# Patient Record
Sex: Female | Born: 1960 | Race: White | Hispanic: No | Smoking: Current every day smoker
Health system: Southern US, Community
[De-identification: ages and names within clinical notes are randomized; demographics above are authoritative.]

## PROBLEM LIST (undated history)

## (undated) DIAGNOSIS — I10 Essential (primary) hypertension: Secondary | ICD-10-CM

## (undated) HISTORY — PX: ANUS SURGERY: SHX302

## (undated) HISTORY — PX: ABDOMINAL HYSTERECTOMY: SHX81

## (undated) HISTORY — PX: EYE SURGERY: SHX253

---

## 2002-02-14 HISTORY — PX: BACK SURGERY: SHX140

## 2005-07-07 ENCOUNTER — Other Ambulatory Visit: Admission: RE | Admit: 2005-07-07 | Discharge: 2005-07-07 | Payer: Self-pay | Admitting: Family Medicine

## 2005-07-18 ENCOUNTER — Encounter: Admission: RE | Admit: 2005-07-18 | Discharge: 2005-07-18 | Payer: Self-pay | Admitting: Family Medicine

## 2005-08-26 ENCOUNTER — Encounter: Admission: RE | Admit: 2005-08-26 | Discharge: 2005-08-26 | Payer: Self-pay | Admitting: Family Medicine

## 2005-09-02 ENCOUNTER — Encounter: Admission: RE | Admit: 2005-09-02 | Discharge: 2005-09-02 | Payer: Self-pay | Admitting: Family Medicine

## 2006-09-25 ENCOUNTER — Other Ambulatory Visit: Admission: RE | Admit: 2006-09-25 | Discharge: 2006-09-25 | Payer: Self-pay | Admitting: *Deleted

## 2007-10-11 ENCOUNTER — Other Ambulatory Visit: Admission: RE | Admit: 2007-10-11 | Discharge: 2007-10-11 | Payer: Self-pay | Admitting: Family Medicine

## 2007-11-01 ENCOUNTER — Encounter: Admission: RE | Admit: 2007-11-01 | Discharge: 2007-11-01 | Payer: Self-pay | Admitting: Family Medicine

## 2007-11-08 ENCOUNTER — Encounter: Admission: RE | Admit: 2007-11-08 | Discharge: 2007-11-08 | Payer: Self-pay | Admitting: Family Medicine

## 2008-09-25 ENCOUNTER — Other Ambulatory Visit: Admission: RE | Admit: 2008-09-25 | Discharge: 2008-09-25 | Payer: Self-pay | Admitting: Family Medicine

## 2009-01-09 ENCOUNTER — Encounter: Admission: RE | Admit: 2009-01-09 | Discharge: 2009-01-09 | Payer: Self-pay | Admitting: Family Medicine

## 2009-11-30 ENCOUNTER — Other Ambulatory Visit: Admission: RE | Admit: 2009-11-30 | Discharge: 2009-11-30 | Payer: Self-pay | Admitting: Family Medicine

## 2009-12-03 ENCOUNTER — Encounter: Admission: RE | Admit: 2009-12-03 | Discharge: 2009-12-03 | Payer: Self-pay | Admitting: Family Medicine

## 2010-01-12 ENCOUNTER — Encounter (INDEPENDENT_AMBULATORY_CARE_PROVIDER_SITE_OTHER): Payer: Self-pay | Admitting: Obstetrics and Gynecology

## 2010-01-12 ENCOUNTER — Inpatient Hospital Stay (HOSPITAL_COMMUNITY)
Admission: RE | Admit: 2010-01-12 | Discharge: 2010-01-13 | Payer: Self-pay | Source: Home / Self Care | Admitting: Obstetrics and Gynecology

## 2010-03-07 ENCOUNTER — Encounter: Payer: Self-pay | Admitting: Family Medicine

## 2010-04-27 LAB — SURGICAL PCR SCREEN
MRSA, PCR: NEGATIVE
Staphylococcus aureus: NEGATIVE

## 2010-04-27 LAB — CBC
HCT: 42.7 % (ref 36.0–46.0)
Hemoglobin: 13 g/dL (ref 12.0–15.0)
MCH: 29.3 pg (ref 26.0–34.0)
MCHC: 33.3 g/dL (ref 30.0–36.0)
Platelets: 211 10*3/uL (ref 150–400)
Platelets: 234 10*3/uL (ref 150–400)
RBC: 4.44 MIL/uL (ref 3.87–5.11)
RDW: 13.1 % (ref 11.5–15.5)
WBC: 14 10*3/uL — ABNORMAL HIGH (ref 4.0–10.5)
WBC: 8.9 10*3/uL (ref 4.0–10.5)

## 2010-04-27 LAB — BASIC METABOLIC PANEL
Calcium: 9.5 mg/dL (ref 8.4–10.5)
GFR calc Af Amer: >60 mL/min (ref >60)
GFR calc non Af Amer: >60 mL/min (ref >60)
Potassium: 4.4 mEq/L (ref 3.5–5.1)
Sodium: 135 mEq/L (ref 135–145)

## 2010-04-27 LAB — PREGNANCY, URINE: Preg Test, Ur: NEGATIVE

## 2012-02-20 ENCOUNTER — Telehealth (HOSPITAL_COMMUNITY): Payer: Self-pay | Admitting: *Deleted

## 2012-02-20 NOTE — Telephone Encounter (Signed)
Telephoned patient at home # and left message to return call to [facility] 

## 2012-02-21 ENCOUNTER — Other Ambulatory Visit: Payer: Self-pay | Admitting: Obstetrics and Gynecology

## 2012-02-21 DIAGNOSIS — Z1231 Encounter for screening mammogram for malignant neoplasm of breast: Secondary | ICD-10-CM

## 2012-03-01 ENCOUNTER — Encounter (HOSPITAL_COMMUNITY): Payer: Self-pay | Admitting: *Deleted

## 2012-03-13 ENCOUNTER — Ambulatory Visit (HOSPITAL_COMMUNITY)
Admission: RE | Admit: 2012-03-13 | Discharge: 2012-03-13 | Disposition: A | Discharge status: Home / Self Care | Payer: Self-pay | Source: Ambulatory Visit | Attending: Obstetrics and Gynecology | Admitting: Obstetrics and Gynecology

## 2012-03-13 ENCOUNTER — Encounter (HOSPITAL_COMMUNITY): Payer: Self-pay

## 2012-03-13 VITALS — BP 128/80 | Temp 98.2°F | Ht 65.0 in | Wt 231.8 lb

## 2012-03-13 DIAGNOSIS — Z1239 Encounter for other screening for malignant neoplasm of breast: Secondary | ICD-10-CM

## 2012-03-13 DIAGNOSIS — Z1231 Encounter for screening mammogram for malignant neoplasm of breast: Secondary | ICD-10-CM

## 2012-03-13 HISTORY — DX: Essential (primary) hypertension: I10

## 2012-03-13 NOTE — Patient Instructions (Signed)
Taught patient how to perform BSE and gave educational materials to take home. Patient did not need a Pap smear today due to a history of a hysterectomy for benign reasons. Told patient she would not need any further Pap smears due to history of a hysterectomy. Let patient know will follow up with her within the next couple weeks with results by letter or phone. Patient escorted to mammography for a screening mammogram. Patient verbalized understanding.

## 2012-03-13 NOTE — Progress Notes (Signed)
No complaints today.  Pap Smear:    Pap smear not completed today. Last Pap smear was 11/30/2009 and normal. Per patient no history of abnormal Pap smears. Patient has a history of a hysterectomy for DUB. Let patient know she would not need any further Pap smears due to her history of hysterectomy. Pap smear result above is in EPIC.  Physical exam: Breasts Breasts symmetrical. No skin abnormalities bilateral breasts. No nipple retraction bilateral breasts. No nipple discharge bilateral breasts. No lymphadenopathy. No lumps palpated bilateral breasts. No complaints of pain or tenderness on exam. Patient escorted to mammography for a screening mammogram.        Pelvic/Bimanual No Pap smear completed today since last Pap smear was 11/30/2009 and patient has a history of a hysterectomy for benign reasons. Pap smear not indicated per [facility] guidelines.

## 2013-04-08 ENCOUNTER — Other Ambulatory Visit: Payer: Self-pay | Admitting: Gastroenterology

## 2013-10-09 ENCOUNTER — Encounter (HOSPITAL_COMMUNITY): Admission: RE | Payer: Self-pay | Source: Ambulatory Visit

## 2013-10-09 ENCOUNTER — Ambulatory Visit (HOSPITAL_COMMUNITY): Admission: RE | Admit: 2013-10-09 | Payer: Self-pay | Source: Ambulatory Visit | Admitting: Obstetrics & Gynecology

## 2013-10-09 SURGERY — ANTERIOR (CYSTOCELE) AND POSTERIOR REPAIR (RECTOCELE)
Anesthesia: General

## 2013-12-16 ENCOUNTER — Encounter (HOSPITAL_COMMUNITY): Payer: Self-pay

## 2014-06-06 ENCOUNTER — Ambulatory Visit (HOSPITAL_COMMUNITY): Payer: BLUE CROSS/BLUE SHIELD | Attending: Cardiology | Admitting: Cardiology

## 2014-06-06 ENCOUNTER — Other Ambulatory Visit (HOSPITAL_COMMUNITY): Payer: Self-pay | Admitting: Family Medicine

## 2014-06-06 DIAGNOSIS — Z720 Tobacco use: Secondary | ICD-10-CM | POA: Diagnosis not present

## 2014-06-06 DIAGNOSIS — I10 Essential (primary) hypertension: Secondary | ICD-10-CM | POA: Insufficient documentation

## 2014-06-06 DIAGNOSIS — Z8249 Family history of ischemic heart disease and other diseases of the circulatory system: Secondary | ICD-10-CM

## 2014-06-06 NOTE — Progress Notes (Signed)
Echo performed. 

## 2014-06-09 ENCOUNTER — Encounter (HOSPITAL_COMMUNITY): Payer: Self-pay | Admitting: Family Medicine

## 2015-05-07 ENCOUNTER — Other Ambulatory Visit: Payer: BLUE CROSS/BLUE SHIELD

## 2015-05-07 ENCOUNTER — Other Ambulatory Visit: Payer: Self-pay | Admitting: Family Medicine

## 2015-05-07 ENCOUNTER — Ambulatory Visit
Admission: RE | Admit: 2015-05-07 | Discharge: 2015-05-07 | Disposition: A | Discharge status: Home / Self Care | Payer: Managed Care, Other (non HMO) | Source: Ambulatory Visit | Attending: Family Medicine | Admitting: Family Medicine

## 2015-05-07 DIAGNOSIS — R52 Pain, unspecified: Secondary | ICD-10-CM

## 2015-08-24 ENCOUNTER — Other Ambulatory Visit: Payer: Self-pay | Admitting: Family Medicine

## 2015-08-24 ENCOUNTER — Other Ambulatory Visit: Payer: Self-pay | Admitting: Obstetrics and Gynecology

## 2015-08-24 DIAGNOSIS — Z1231 Encounter for screening mammogram for malignant neoplasm of breast: Secondary | ICD-10-CM

## 2015-09-10 ENCOUNTER — Ambulatory Visit
Admission: RE | Admit: 2015-09-10 | Discharge: 2015-09-10 | Disposition: A | Discharge status: Home / Self Care | Payer: Managed Care, Other (non HMO) | Source: Ambulatory Visit | Attending: Family Medicine | Admitting: Family Medicine

## 2015-09-10 DIAGNOSIS — Z1231 Encounter for screening mammogram for malignant neoplasm of breast: Secondary | ICD-10-CM

## 2016-05-31 ENCOUNTER — Other Ambulatory Visit: Payer: Self-pay | Admitting: Family Medicine

## 2016-05-31 DIAGNOSIS — F17200 Nicotine dependence, unspecified, uncomplicated: Secondary | ICD-10-CM

## 2016-06-29 ENCOUNTER — Ambulatory Visit
Admission: RE | Admit: 2016-06-29 | Discharge: 2016-06-29 | Disposition: A | Discharge status: Home / Self Care | Payer: BLUE CROSS/BLUE SHIELD | Source: Ambulatory Visit | Attending: Family Medicine | Admitting: Family Medicine

## 2016-06-29 DIAGNOSIS — F17200 Nicotine dependence, unspecified, uncomplicated: Secondary | ICD-10-CM

## 2017-03-23 ENCOUNTER — Other Ambulatory Visit: Payer: Self-pay | Admitting: Family Medicine

## 2017-03-23 DIAGNOSIS — Z1231 Encounter for screening mammogram for malignant neoplasm of breast: Secondary | ICD-10-CM

## 2017-04-13 ENCOUNTER — Ambulatory Visit
Admission: RE | Admit: 2017-04-13 | Discharge: 2017-04-13 | Disposition: A | Discharge status: Home / Self Care | Payer: BLUE CROSS/BLUE SHIELD | Source: Ambulatory Visit | Attending: Family Medicine | Admitting: Family Medicine

## 2017-04-13 DIAGNOSIS — Z1231 Encounter for screening mammogram for malignant neoplasm of breast: Secondary | ICD-10-CM

## 2017-06-06 ENCOUNTER — Other Ambulatory Visit: Payer: Self-pay | Admitting: Family Medicine

## 2017-06-06 ENCOUNTER — Telehealth: Payer: Self-pay | Admitting: Acute Care

## 2017-06-06 DIAGNOSIS — F17200 Nicotine dependence, unspecified, uncomplicated: Secondary | ICD-10-CM

## 2017-06-08 NOTE — Telephone Encounter (Signed)
Noted. Referral cancelled. 

## 2017-06-15 ENCOUNTER — Other Ambulatory Visit: Payer: BLUE CROSS/BLUE SHIELD

## 2017-10-03 ENCOUNTER — Encounter: Payer: Self-pay | Admitting: Physical Therapy

## 2017-10-03 ENCOUNTER — Other Ambulatory Visit: Payer: Self-pay

## 2017-10-03 ENCOUNTER — Ambulatory Visit: Payer: BLUE CROSS/BLUE SHIELD | Attending: Orthopedic Surgery | Admitting: Physical Therapy

## 2017-10-03 DIAGNOSIS — M5441 Lumbago with sciatica, right side: Secondary | ICD-10-CM | POA: Insufficient documentation

## 2017-10-03 DIAGNOSIS — M5442 Lumbago with sciatica, left side: Secondary | ICD-10-CM | POA: Insufficient documentation

## 2017-10-03 DIAGNOSIS — M62830 Muscle spasm of back: Secondary | ICD-10-CM | POA: Insufficient documentation

## 2017-10-03 NOTE — Therapy (Signed)
[facility] [address] [place], [place], [address] Phone: [phone]   Fax:  [phone]  Physical Therapy Evaluation  Patient Details  Name: Becky ShipperVirgie A Pietrzyk MRN: [id number] Date of Birth: 25-Feb-1960 Referring Provider: Charlett Blakevoytek   Encounter Date: 10/03/2017  PT End of Session - 10/03/17 0957    Visit Number  1    Date for PT Re-Evaluation  12/03/17    PT Start Time  0930    PT Stop Time  1020    PT Time Calculation (min)  50 min    Activity Tolerance  Patient tolerated treatment well    Behavior During Therapy  [facility] for tasks assessed/performed       Past Medical History:  Diagnosis Date  . Hypertension     Past Surgical History:  Procedure Laterality Date  . ABDOMINAL HYSTERECTOMY    . BACK SURGERY  2004    There were no vitals filed for this visit.   Subjective Assessment - 10/03/17 0936    Subjective  Patient reports that she has had some LBP for about a year, reports that it has gotten worse over the past year and started having LE pain.  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  X-rays show severe stenosis    Limitations  Lifting;Standing;House hold activities    How long can you stand comfortably?  10 minutes    Diagnostic tests  x-rays    Patient Stated Goals  have less pain, work without pain    Currently in Pain?  Yes    Pain Score  2     Pain Location  Back    Pain Orientation  Lower    Pain Descriptors / Indicators  Aching;Dull    Pain Type  Acute pain    Pain Radiating Towards  pain into both hips and the legs mostly in the thighs but at times past knees, some times a tingling/numbness    Pain Onset  More than a month ago    Pain Frequency  Constant    Aggravating Factors   bending, lifting, standing pain can be 10/10    Pain Relieving Factors  rest, Tylenol at best pain can be 2/10    Effect of Pain on Daily Activities  hurts and limits work and  ADL's         [facility] PT Assessment - 10/03/17 0001      Assessment   Medical Diagnosis  LBP with radiculopathy    Referring Provider  voytek    Onset Date/Surgical Date  09/02/17    Prior Therapy  no      Precautions   Precautions  None      Balance Screen   Has the patient fallen in the past 6 months  No    Has the patient had a decrease in activity level because of a fear of falling?   No    Is the patient reluctant to leave their home because of a fear of falling?   No      Home Environment   Additional Comments  does some housework, has not been able to do yardwork      Prior Function   Level of Independence  Independent    Vocation  Full time employment    Industrial/product designerVocation Requirements  Nurse home health    Leisure  no exercise      Posture/Postural Control  Posture Comments  fwd head, rounded shoulders      ROM / Strength   AROM / PROM / Strength  AROM;Strength      AROM   Overall AROM Comments  Lumbar ROM WNL's for flexion, other motions limited 50% with pain      Strength   Overall Strength Comments  4/5 with some LBP      Palpation   Palpation comment  she is very tight in the lumbar paraspinals and the buttock, some tenderness                Objective measurements completed on examination: See above findings.      [facility] Adult PT Treatment/Exercise - 10/03/17 0001      Modalities   Modalities  Electrical Stimulation;Moist Heat      Moist Heat Therapy   Number Minutes Moist Heat  15 Minutes    Moist Heat Location  Lumbar Spine      Electrical Stimulation   Electrical Stimulation Location  low back    Electrical Stimulation Action  IFC    Electrical Stimulation Parameters  sitting    Electrical Stimulation Goals  Pain             PT Education - 10/03/17 0957    Education Details  Wms flexion exercises    Person(s) Educated  Patient    Methods  Explanation;Demonstration;Handout    Comprehension  Verbalized understanding       PT  Short Term Goals - 10/03/17 1004      PT SHORT TERM GOAL #1   Title  independent with initial HEP    Time  3    Period  Weeks    Status  New        PT Long Term Goals - 10/03/17 1004      PT LONG TERM GOAL #1   Title  understand proper posture and body mechanics    Time  8    Period  Weeks    Status  New      PT LONG TERM GOAL #2   Title  reports pain decreased 50%    Time  8    Period  Weeks    Status  New      PT LONG TERM GOAL #3   Title  normal lumbar ROM    Time  8    Period  Weeks    Status  New      PT LONG TERM GOAL #4   Title  go up and down stairs without fear    Time  8    Period  Weeks    Status  New             Plan - 10/03/17 [id number]    Clinical Impression Statement  Patient with LBP for about a year and has gotten worse to the point she is having buttock and leg pain.  Also c/o numbness at times.  X-rays show stenosis.  Has some limitations with ROM, has severe tightness and spasms in the buttocks and the lumbar paraspinals.  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  May need body mechanics and problem solving for this to decrease stress on the back    History and Personal Factors relevant to plan of care:  3 level laminectomy in 2004 lumbar    Clinical Presentation  Stable    Clinical Decision Making  Low  Rehab Potential  Good    PT Frequency  2x / week    PT Duration  8 weeks    PT Treatment/Interventions  ADLs/Self Care Home Management;Electrical Stimulation;Moist Heat;Traction;Therapeutic activities;Therapeutic exercise;Manual techniques;Patient/family education;Neuromuscular re-education;Dry needling    PT Next Visit Plan  slowly start gym for core stability    Consulted and Agree with Plan of Care  Patient       Patient will benefit from skilled therapeutic intervention in order to improve the following deficits and impairments:  Impaired tone, Decreased activity tolerance, Decreased  strength, Pain, Increased muscle spasms, Improper body mechanics, Decreased range of motion, Decreased safety awareness, Impaired flexibility, Postural dysfunction  Visit Diagnosis: Acute bilateral low back pain with bilateral sciatica - Plan: PT plan of care cert/re-cert  Muscle spasm of back - Plan: PT plan of care cert/re-cert     Problem List There are no active problems to display for this patient.   Jearld Lesch., PT 10/03/2017, 10:07 AM  [facility] [address] [facility] [address] [place], [place], [address] Phone: [phone]   Fax:  [phone]  Name: LELIANA KONTZ MRN: [id number] Date of Birth: [birth year]

## 2017-10-05 ENCOUNTER — Ambulatory Visit: Payer: BLUE CROSS/BLUE SHIELD | Admitting: Physical Therapy

## 2017-10-05 ENCOUNTER — Encounter: Payer: Self-pay | Admitting: Physical Therapy

## 2017-10-05 DIAGNOSIS — M5441 Lumbago with sciatica, right side: Secondary | ICD-10-CM

## 2017-10-05 DIAGNOSIS — M5442 Lumbago with sciatica, left side: Secondary | ICD-10-CM | POA: Diagnosis not present

## 2017-10-05 DIAGNOSIS — M62830 Muscle spasm of back: Secondary | ICD-10-CM

## 2017-10-05 NOTE — Therapy (Signed)
[facility] [place] [facility] [address] [place], [place], [address] Phone: [phone]   Fax:  [phone]  Physical Therapy Treatment  Patient Details  Name: Becky Carney MRN: [id number] Date of Birth: 1960/06/18 Referring Provider: Charlett Blake   Encounter Date: 10/05/2017  PT End of Session - 10/05/17 0923    Visit Number  2    Date for PT Re-Evaluation  12/03/17    PT Start Time  0845    PT Stop Time  0944    PT Time Calculation (min)  59 min       Past Medical History:  Diagnosis Date  . Hypertension     Past Surgical History:  Procedure Laterality Date  . ABDOMINAL HYSTERECTOMY    . BACK SURGERY  2004    There were no vitals filed for this visit.  Subjective Assessment - 10/05/17 0849    Subjective  "okay" , very painful at end of day and driving is awful    Currently in Pain?  Yes    Pain Score  5     Pain Location  Back                       [facility] Adult PT Treatment/Exercise - 10/05/17 0001      Self-Care   Self-Care  ADL's;Lifting   job related BM     Exercises   Exercises  Lumbar;Knee/Hip      Lumbar Exercises: Seated   Other Seated Lumbar Exercises  pelvic ROM 15 times 4 ways on sit fit   added LAQ,marching and hip abd 10 each     Lumbar Exercises: Supine   Ab Set  15 reps    Clam  15 reps   green tband   Bridge  10 reps;3 seconds   feet on ball   Bridge with [employer]  15 reps   no brideg PPT   Bridge with [employer] Limitations  green tband PPT     Other Supine Lumbar Exercises  KTC and obl ,feet on ball      Modalities   Modalities  Electrical Stimulation;Moist Heat      Moist Heat Therapy   Number Minutes Moist Heat  15 Minutes    Moist Heat Location  Lumbar Spine      Electrical Stimulation   Electrical Stimulation Location  low back    Electrical Stimulation Action  IFC    Electrical Stimulation Parameters  supine    Electrical Stimulation Goals  Pain      Manual Therapy   Manual Therapy  Passive ROM    Passive ROM  LE             PT Education - 10/05/17 [phone]    Education Details  TENS info issued    Person(s) Educated  Patient    Methods  Explanation;Handout    Comprehension  Verbalized understanding       PT Short Term Goals - 10/03/17 1004      PT SHORT TERM GOAL #1   Title  independent with initial HEP    Time  3    Period  Weeks    Status  New        PT Long Term Goals - 10/03/17 1004      PT LONG TERM GOAL #1   Title  understand proper posture and body mechanics    Time  8    Period  Weeks  Status  New      PT LONG TERM GOAL #2   Title  reports pain decreased 50%    Time  8    Period  Weeks    Status  New      PT LONG TERM GOAL #3   Title  normal lumbar ROM    Time  8    Period  Weeks    Status  New      PT LONG TERM GOAL #4   Title  go up and down stairs without fear    Time  8    Period  Weeks    Status  New            Plan - 10/05/17 [id number]    Clinical Impression Statement  educ on BM and lifting for total care of 57 y.o pt. educ on self positioning and using LE vs back- pt VU. issued info on TENS ordering for pain control. tolerated initial core stab ex well, cuing needed    PT Treatment/Interventions  ADLs/Self Care Home Management;Electrical Stimulation;Moist Heat;Traction;Therapeutic activities;Therapeutic exercise;Manual techniques;Patient/family education;Neuromuscular re-education;Dry needling    PT Next Visit Plan  assess BM and progress [email or web address] on TENS when pt gets unit       Patient will benefit from skilled therapeutic intervention in order to improve the following deficits and impairments:  Impaired tone, Decreased activity tolerance, Decreased strength, Pain, Increased muscle spasms, Improper body mechanics, Decreased range of motion, Decreased safety awareness, Impaired flexibility, Postural dysfunction  Visit Diagnosis: Acute bilateral low back pain with bilateral  sciatica  Muscle spasm of back     Problem List There are no active problems to display for this patient.   PAYSEUR,ANGIE PTA 10/05/2017, 9:26 AM  [facility] [address] [place], [place], [address] Phone: [phone]   Fax:  [phone]  Name: Becky Carney MRN: [id number] Date of Birth: 01/26/1961

## 2017-10-10 ENCOUNTER — Ambulatory Visit: Payer: BLUE CROSS/BLUE SHIELD | Admitting: Physical Therapy

## 2017-10-10 DIAGNOSIS — M5441 Lumbago with sciatica, right side: Secondary | ICD-10-CM

## 2017-10-10 DIAGNOSIS — M5442 Lumbago with sciatica, left side: Secondary | ICD-10-CM | POA: Diagnosis not present

## 2017-10-10 DIAGNOSIS — M62830 Muscle spasm of back: Secondary | ICD-10-CM

## 2017-10-10 NOTE — Patient Instructions (Signed)
Lower abdominal/core stability exercises  1. Practice your breathing technique: Inhale through your nose expanding your belly and rib cage. Try not to breathe into your chest. Exhale slowly and gradually out your mouth feeling a sense of softness to your body. Practice multiple times. This can be performed unlimited.  2. Finding the lower abdominals. Laying on your back with the knees bent, place your fingers just below your belly button. Using your breathing technique from above, on your exhale gently pull the belly button away from your fingertips without tensing any other muscles. Practice this 5x. Next, as you exhale, draw belly button inwards and hold onto it...then feel as if you are pulling that muscle across your pelvis like you are tightening a belt. This can be hard to do at first so be patient and practice. Do 5-10 reps 1-3 x day. Always recognize quality over quantity; if your abdominal muscles become tired you will notice you may tighten/contract other muscles. This is the time to take a break.   Practice this first laying on your back, then in sitting, progressing to standing and finally adding it to all your daily movements.   3. Finding your pelvic floor. Using the breathing technique above, when your exhale, this time draw your pelvic floor muscles up as if you were attempting to stop the flow of urination. Be careful NOT to tense any other muscles. This can be hard, BE PATIENT. Try to hold up to 10 seconds repeating 10x. Try 2x a day. Once you feel you are doing this well, add this contraction to exercise #2. First contracting your pelvic floor followed by lower abdominals.   4. Adding leg movements. Add the following leg movements to challenge your ability to keep your core stable:  1. Single leg drop outs: Laying on your back with knees bent feet flat. Inhale,  dropping one knee outward KEEPING YOUR PELVIS STILL. Exhale as you bring the leg back, simultaneously performing your lower  abdominal contraction. Do 5-10 on each leg.   2. Marching: While keeping your pelvis still, lift the right foot a few inches, put it down then lift left foot. This will mimic a march. Start slow to establish control. Once you have control you may speed it up. Do 10-20x. You MUST keep your lower abdominlas contracted while you march. Breathe naturally    3. Single leg slides: Inhale while you slowly slide one leg out keeping your pelvis still. Only slide your leg as far as you can keep your pelvis still. Exhale as you bring the leg back to the start, contracting the lower abdominals as you do that. Keep your upper body relaxed. Do 5-10 on each side.    Solon PalmJulie Egypt Welcome, PT 10/10/17 8:32 AM [facility] [address] [place], [place], [address] Phone: [phone]   Fax:  [phone]

## 2017-10-10 NOTE — Therapy (Signed)
[facility] [place] [facility] [address] [place], [place], [address] Phone: [phone]   Fax:  [phone]  Physical Therapy Treatment  Patient Details  Name: Becky Carney MRN: [id number] Date of Birth: [birth year] Referring Provider: Charlett Blake   Encounter Date: 10/10/2017  PT End of Session - 10/10/17 0803    Visit Number  3    Date for PT Re-Evaluation  12/03/17    PT Start Time  0800    PT Stop Time  0903    PT Time Calculation (min)  63 min    Activity Tolerance  Patient tolerated treatment well    Behavior During Therapy  [facility] for tasks assessed/performed       Past Medical History:  Diagnosis Date  . Hypertension     Past Surgical History:  Procedure Laterality Date  . ABDOMINAL HYSTERECTOMY    . BACK SURGERY  2004    There were no vitals filed for this visit.  Subjective Assessment - 10/10/17 0805    Subjective  Worked 14 hours yesterday    Limitations  Lifting;Standing;House hold activities    How long can you stand comfortably?  10 minutes    Patient Stated Goals  have less pain, work without pain    Currently in Pain?  Yes    Pain Score  5     Pain Location  Back    Pain Orientation  Lower    Pain Descriptors / Indicators  Aching                       [facility] Adult PT Treatment/Exercise - 10/10/17 0001      Self-Care   Self-Care  Other Self-Care Comments    Other Self-Care Comments   TENS unit education      Exercises   Exercises  Lumbar;Knee/Hip      Lumbar Exercises: Aerobic   Nustep  L3 x 5 min      Lumbar Exercises: Supine   Ab Set  5 reps    AB Set Limitations  10 sec hold; after Kegel contraction 5 sec x 5    Clam  15 reps    Heel Slides  10 reps   Bil   Bridge  10 reps;3 seconds   feet on ball   Bridge with [employer]  15 reps        Knee/Hip Exercises: Stretches   Knee: Self-Stretch to increase Flexion  Both;2 reps;30 seconds   DKTC 2x30 sec     Modalities   Modalities  Electrical Stimulation;Moist Heat      Moist Heat Therapy   Number Minutes Moist Heat  15 Minutes    Moist Heat Location  Lumbar Spine      Electrical Stimulation   Electrical Stimulation Location  low back    Electrical Stimulation Action  IFC    Electrical Stimulation Parameters  supine    Electrical Stimulation Goals  Pain             PT Education - 10/10/17 0949    Education Details  HEP; self care TENs education; contraindications and precautions discussed; patient Ind in donning/doffing TENS unit.    Person(s) Educated  Patient    Methods  Explanation;Demonstration;Verbal cues;Handout    Comprehension  Verbalized understanding;Returned demonstration       PT Short Term Goals - 10/03/17 1004      PT SHORT TERM GOAL #1   Title  independent with  initial HEP    Time  3    Period  Weeks    Status  New        PT Long Term Goals - 10/03/17 1004      PT LONG TERM GOAL #1   Title  understand proper posture and body mechanics    Time  8    Period  Weeks    Status  New      PT LONG TERM GOAL #2   Title  reports pain decreased 50%    Time  8    Period  Weeks    Status  New      PT LONG TERM GOAL #3   Title  normal lumbar ROM    Time  8    Period  Weeks    Status  New      PT LONG TERM GOAL #4   Title  go up and down stairs without fear    Time  8    Period  Weeks    Status  New            Plan - 10/10/17 [id number]    Clinical Impression Statement  Patient did very well with transverse abdominus training and reported relief with unweighting spine and with bridging. PT encouraged multiple periods of lying supine during the day to decrease pain and increase endurance as well. Continued to encourage good body mechanics at work.    PT Treatment/Interventions  ADLs/Self Care Home Management;Electrical Stimulation;Moist Heat;Traction;Therapeutic activities;Therapeutic exercise;Manual techniques;Patient/family education;Neuromuscular re-education;Dry  needling    PT Next Visit Plan  continue TA progression and extension biased exercises.    PT Home Exercise Plan  TA - with clams, march, heel slides and breathing       Patient will benefit from skilled therapeutic intervention in order to improve the following deficits and impairments:  Impaired tone, Decreased activity tolerance, Decreased strength, Pain, Increased muscle spasms, Improper body mechanics, Decreased range of motion, Decreased safety awareness, Impaired flexibility, Postural dysfunction  Visit Diagnosis: Acute bilateral low back pain with bilateral sciatica  Muscle spasm of back     Problem List There are no active problems to display for this patient.   Aby Gessel PT 10/10/2017, 9:57 AM  [facility] [place] [place] [address] [place], [place], [address] Phone: [phone]   Fax:  [phone]  Name: Becky Carney MRN: [id number] Date of Birth: 22-Nov-1960

## 2017-10-12 ENCOUNTER — Encounter: Payer: Self-pay | Admitting: Physical Therapy

## 2017-10-12 ENCOUNTER — Ambulatory Visit: Payer: BLUE CROSS/BLUE SHIELD | Admitting: Physical Therapy

## 2017-10-12 DIAGNOSIS — M62830 Muscle spasm of back: Secondary | ICD-10-CM

## 2017-10-12 DIAGNOSIS — M5441 Lumbago with sciatica, right side: Secondary | ICD-10-CM

## 2017-10-12 DIAGNOSIS — M5442 Lumbago with sciatica, left side: Principal | ICD-10-CM

## 2017-10-12 NOTE — Therapy (Signed)
[place], [place], [address] Phone: [phone]   Fax:  [phone]  Physical Therapy Treatment  Patient Details  Name: Becky Carney MRN: [id number] Date of Birth: 1960/07/07 Referring Provider: Lynann Bologna   Encounter Date: 10/12/2017  PT End of Session - 10/12/17 1007    Visit Number  4    Date for PT Re-Evaluation  12/03/17    PT Start Time  0930    PT Stop Time  1025    PT Time Calculation (min)  55 min       Past Medical History:  Diagnosis Date  . Hypertension     Past Surgical History:  Procedure Laterality Date  . ABDOMINAL HYSTERECTOMY    . BACK SURGERY  2004    There were no vitals filed for this visit.  Subjective Assessment - 10/12/17 0934    Subjective  overall better. body mechanics, standing postition and TENS are all helping    Currently in Pain?  Yes    Pain Score  3     Pain Location  Back    Pain Orientation  Lower                       [facility] Adult PT Treatment/Exercise - 10/12/17 0001      Exercises   Exercises  Lumbar;Knee/Hip      Lumbar Exercises: Aerobic   Nustep  L 4 6 min      Lumbar Exercises: Machines for Strengthening   Cybex Lumbar Extension  black tband 2 sets 10   trunk flex 2 sets 10   Other Lumbar Machine Exercise  rows and lats 20# 2 sets 10      Lumbar Exercises: Standing   Other Standing Lumbar Exercises  pulley obl 15 each 5# with cuing      Lumbar Exercises: Supine   Ab Set  10 reps    AB Set Limitations  10 sec hold; after Kegel contraction 5 sec x 5    Clam  15 reps;3 seconds   green tband   Bridge  15 reps;3 seconds   feet on ball   Bridge with [employer]  15 reps      Knee/Hip Exercises: Standing   Other Standing Knee Exercises  red tband hip 3 way 15 times each   LBP     Knee/Hip Exercises: Seated   Sit to Sand  2 sets;10 reps;without UE support   wt ball chest press     Moist Heat Therapy    Number Minutes Moist Heat  15 Minutes    Moist Heat Location  Lumbar Spine      Electrical Stimulation   Electrical Stimulation Location  low back    Electrical Stimulation Action  IFC    Electrical Stimulation Parameters  supine    Electrical Stimulation Goals  Pain               PT Short Term Goals - 10/12/17 1001      PT SHORT TERM GOAL #1   Title  independent with initial HEP    Status  Achieved        PT Long Term Goals - 10/12/17 1001      PT LONG TERM GOAL #1   Title  understand proper posture and body mechanics    Status  Partially Met      PT LONG TERM GOAL #2   Title  reports pain decreased 50%    Status  Partially Met      PT LONG TERM GOAL #3   Title  normal lumbar ROM    Status  Partially Met      PT LONG TERM GOAL #4   Title  go up and down stairs without fear    Status  Partially Met            Plan - 10/12/17 1007    Clinical Impression Statement  pt seeing good improvement with b=changed BM ,standing posture and TENS use. increase ther ex today and tolerated with some increased pain but needed cuing for core activation. progressing with goals    PT Treatment/Interventions  ADLs/Self Care Home Management;Electrical Stimulation;Moist Heat;Traction;Therapeutic activities;Therapeutic exercise;Manual techniques;Patient/family education;Neuromuscular re-education;Dry needling    PT Next Visit Plan  core activation ex       Patient will benefit from skilled therapeutic intervention in order to improve the following deficits and impairments:  Impaired tone, Decreased activity tolerance, Decreased strength, Pain, Increased muscle spasms, Improper body mechanics, Decreased range of motion, Decreased safety awareness, Impaired flexibility, Postural dysfunction  Visit Diagnosis: Acute bilateral low back pain with bilateral sciatica  Muscle spasm of back     Problem List There are no active problems to display for this  patient.   Ramaj Frangos,ANGIE PTA 10/12/2017, 10:09 AM  [place], [place], [address] Phone: [phone]   Fax:  [phone]  Name: Becky Carney MRN: [id number] Date of Birth: August 28, 1960

## 2017-10-17 ENCOUNTER — Encounter: Payer: Self-pay | Admitting: Physical Therapy

## 2017-10-17 ENCOUNTER — Ambulatory Visit: Payer: BLUE CROSS/BLUE SHIELD | Attending: Orthopedic Surgery | Admitting: Physical Therapy

## 2017-10-17 DIAGNOSIS — M5442 Lumbago with sciatica, left side: Secondary | ICD-10-CM | POA: Diagnosis not present

## 2017-10-17 DIAGNOSIS — M5441 Lumbago with sciatica, right side: Secondary | ICD-10-CM | POA: Insufficient documentation

## 2017-10-17 DIAGNOSIS — M62830 Muscle spasm of back: Secondary | ICD-10-CM | POA: Diagnosis present

## 2017-10-17 NOTE — Therapy (Signed)
[place], [place], [address] Phone: [phone]   Fax:  [phone]  Physical Therapy Treatment  Patient Details  Name: Becky Carney MRN: [id number] Date of Birth: [birth year] Referring Provider: Lynann Bologna   Encounter Date: 10/17/2017  PT End of Session - 10/17/17 1022    Visit Number  5    Date for PT Re-Evaluation  12/03/17    PT Start Time  0930    PT Stop Time  6962    PT Time Calculation (min)  65 min       Past Medical History:  Diagnosis Date  . Hypertension     Past Surgical History:  Procedure Laterality Date  . ABDOMINAL HYSTERECTOMY    . BACK SURGERY  2004    There were no vitals filed for this visit.  Subjective Assessment - 10/17/17 0934    Subjective  walking in felt crunch in LB. overall better. mopped yesterday and could barely do 2 rooms- increased pain    Currently in Pain?  Yes    Pain Score  3     Pain Location  Back                       [facility] Adult PT Treatment/Exercise - 10/17/17 0001      Self-Care   Self-Care  ADL's   vacuum and mopping     Exercises   Exercises  Lumbar;Knee/Hip      Lumbar Exercises: Aerobic   UBE (Upper Arm Bike)  L 2 2 fwd/2 back      Lumbar Exercises: Machines for Strengthening   Cybex Lumbar Extension  black tband 2 sets 10   trunk flex 20 black band   Leg Press  40# 2 sets 10    Other Lumbar Machine Exercise  rows and lats 20# 2 sets 15      Lumbar Exercises: Supine   Ab Set  15 reps;3 seconds    Bridge  15 reps;3 seconds   feet on ball   Other Supine Lumbar Exercises  KTC and obl ,feet on ball      Knee/Hip Exercises: Machines for Strengthening   Cybex Knee Extension  10# 2 sets 10    Cybex Knee Flexion  20# 2 sets 10      Knee/Hip Exercises: Standing   Other Standing Knee Exercises  red tband hip 3 way 15 times each      Knee/Hip Exercises: Seated   Sit to Sand  2 sets;10 reps;without UE support    wt ball chest press     Moist Heat Therapy   Number Minutes Moist Heat  15 Minutes    Moist Heat Location  Lumbar Spine      Electrical Stimulation   Electrical Stimulation Location  low back    Electrical Stimulation Action  IFC    Electrical Stimulation Parameters  seated    Electrical Stimulation Goals  Pain      Manual Therapy   Manual Therapy  Passive ROM    Passive ROM  LE               PT Short Term Goals - 10/12/17 1001      PT SHORT TERM GOAL #1   Title  independent with initial HEP    Status  Achieved        PT Long Term Goals - 10/12/17 1001      PT  LONG TERM GOAL #1   Title  understand proper posture and body mechanics    Status  Partially Met      PT LONG TERM GOAL #2   Title  reports pain decreased 50%    Status  Partially Met      PT LONG TERM GOAL #3   Title  normal lumbar ROM    Status  Partially Met      PT LONG TERM GOAL #4   Title  go up and down stairs without fear    Status  Partially Met            Plan - 10/17/17 1022    Clinical Impression Statement  initiated machine ex for LE today for strength. continue to cue to engage core with ex. progressing with goals. educ on BM an duse of LE with mopping/vacuuming    PT Treatment/Interventions  ADLs/Self Care Home Management;Electrical Stimulation;Moist Heat;Traction;Therapeutic activities;Therapeutic exercise;Manual techniques;Patient/family education;Neuromuscular re-education;Dry needling    PT Next Visit Plan  core activation ex       Patient will benefit from skilled therapeutic intervention in order to improve the following deficits and impairments:  Impaired tone, Decreased activity tolerance, Decreased strength, Pain, Increased muscle spasms, Improper body mechanics, Decreased range of motion, Decreased safety awareness, Impaired flexibility, Postural dysfunction  Visit Diagnosis: Acute bilateral low back pain with bilateral sciatica  Muscle spasm of  back     Problem List There are no active problems to display for this patient.   Jaemarie Hochberg,ANGIE PTA 10/17/2017, 10:24 AM  [place], [place], [address] Phone: [phone]   Fax:  [phone]  Name: Becky Carney MRN: [id number] Date of Birth: 02/26/1960

## 2017-10-24 ENCOUNTER — Encounter: Payer: Self-pay | Admitting: Physical Therapy

## 2017-10-24 ENCOUNTER — Ambulatory Visit: Payer: BLUE CROSS/BLUE SHIELD | Admitting: Physical Therapy

## 2017-10-24 DIAGNOSIS — M5442 Lumbago with sciatica, left side: Secondary | ICD-10-CM | POA: Diagnosis not present

## 2017-10-24 DIAGNOSIS — M5441 Lumbago with sciatica, right side: Secondary | ICD-10-CM

## 2017-10-24 DIAGNOSIS — M62830 Muscle spasm of back: Secondary | ICD-10-CM

## 2017-10-24 NOTE — Therapy (Signed)
[place], [place], [address] Phone: [phone]   Fax:  [phone]  Physical Therapy Treatment  Patient Details  Name: Becky Carney MRN: [id number] Date of Birth: [birth year] Referring Provider: Lynann Bologna   Encounter Date: 10/24/2017  PT End of Session - 10/24/17 0954    Visit Number  6    Date for PT Re-Evaluation  12/03/17    PT Start Time  0930    PT Stop Time  1030    PT Time Calculation (min)  60 min       Past Medical History:  Diagnosis Date  . Hypertension     Past Surgical History:  Procedure Laterality Date  . ABDOMINAL HYSTERECTOMY    . BACK SURGERY  2004    There were no vitals filed for this visit.  Subjective Assessment - 10/24/17 0935    Subjective  back still hurts- using heat . left pain in head ( points behind ear) comes and goes but when it hits its a 10/10- already happened 6 times this morning    Currently in Pain?  Yes    Pain Score  6     Pain Location  Back                       [facility] Adult PT Treatment/Exercise - 10/24/17 0001      Lumbar Exercises: Aerobic   Nustep  L 5 7 min      Lumbar Exercises: Machines for Strengthening   Cybex Lumbar Extension  black tband 2 sets 15   flex and ext   Other Lumbar Machine Exercise  rows and lats 20# 2 sets 15      Lumbar Exercises: Standing   Other Standing Lumbar Exercises  pulley obl 15 each 5# with cuing      Knee/Hip Exercises: Machines for Strengthening   Cybex Knee Extension  10# 2 sets 10    Cybex Knee Flexion  20# 2 sets 10      Knee/Hip Exercises: Standing   Other Standing Knee Exercises  red tband hip 3 way 15 times each      Moist Heat Therapy   Number Minutes Moist Heat  15 Minutes    Moist Heat Location  Lumbar Spine      Electrical Stimulation   Electrical Stimulation Location  low back    Electrical Stimulation Action  IFC    Electrical Stimulation Parameters  seated     Electrical Stimulation Goals  Pain       Trigger Point Dry Needling - 10/24/17 1156    Consent Given?  Yes    Education Handout Provided  Yes    Muscles Treated Upper Body  Longissimus;Quadratus Lumborum    Longissimus Response  Palpable increased muscle length             PT Short Term Goals - 10/12/17 1001      PT SHORT TERM GOAL #1   Title  independent with initial HEP    Status  Achieved        PT Long Term Goals - 10/12/17 1001      PT LONG TERM GOAL #1   Title  understand proper posture and body mechanics    Status  Partially Met      PT LONG TERM GOAL #2   Title  reports pain decreased 50%    Status  Partially Met  PT LONG TERM GOAL #3   Title  normal lumbar ROM    Status  Partially Met      PT LONG TERM GOAL #4   Title  go up and down stairs without fear    Status  Partially Met            Plan - 10/24/17 0952    Clinical Impression Statement  pt verb saw MD last week and tehy discussed inject ( but last one didn't help ) and possible surgery. pt feels some better at times but unsure how much PT is helping. tried DN today to see if taht helped with LBP. told pt if shooting pain in head/neck continues to call MD.    PT Treatment/Interventions  ADLs/Self Care Home Management;Electrical Stimulation;Moist Heat;Traction;Therapeutic activities;Therapeutic exercise;Manual techniques;Patient/family education;Neuromuscular re-education;Dry needling    PT Next Visit Plan  core activation ex, assess how DN was       Patient will benefit from skilled therapeutic intervention in order to improve the following deficits and impairments:  Impaired tone, Decreased activity tolerance, Decreased strength, Pain, Increased muscle spasms, Improper body mechanics, Decreased range of motion, Decreased safety awareness, Impaired flexibility, Postural dysfunction  Visit Diagnosis: Acute bilateral low back pain with bilateral sciatica  Muscle spasm of  back     Problem List There are no active problems to display for this patient.   Sumner Boast., PT 10/24/2017, 11:56 AM  [place] [address] [place], [place], [address] Phone: [phone]   Fax:  [phone]  Name: Becky Carney MRN: [id number] Date of Birth: 30-Aug-1960

## 2017-10-24 NOTE — Patient Instructions (Signed)

## 2017-10-26 ENCOUNTER — Ambulatory Visit: Payer: BLUE CROSS/BLUE SHIELD | Admitting: Physical Therapy

## 2017-10-26 DIAGNOSIS — M5442 Lumbago with sciatica, left side: Secondary | ICD-10-CM | POA: Diagnosis not present

## 2017-10-26 DIAGNOSIS — M62830 Muscle spasm of back: Secondary | ICD-10-CM

## 2017-10-26 DIAGNOSIS — M5441 Lumbago with sciatica, right side: Secondary | ICD-10-CM

## 2017-10-26 NOTE — Therapy (Signed)
[place], [place], [address] Phone: [phone]   Fax:  [phone]  Physical Therapy Treatment  Patient Details  Name: Becky Carney MRN: [id number] Date of Birth: Dec 29, 1960 Referring Provider: Lynann Bologna   Encounter Date: 10/26/2017  PT End of Session - 10/26/17 1304    Visit Number  7    Date for PT Re-Evaluation  12/03/17    PT Start Time  1300    PT Stop Time  1403    PT Time Calculation (min)  63 min    Activity Tolerance  Patient tolerated treatment well    Behavior During Therapy  [facility] for tasks assessed/performed       Past Medical History:  Diagnosis Date  . Hypertension     Past Surgical History:  Procedure Laterality Date  . ABDOMINAL HYSTERECTOMY    . BACK SURGERY  2004    There were no vitals filed for this visit.  Subjective Assessment - 10/26/17 1305    Subjective  Patient responded well to DN x 4 hours then pain returned. She had no pain this morning when she awoke, but was able to sleep 10 hours. This morning reaggravated bending at sink.. She had no pain driving home after 13 hour shift.    Limitations  Lifting;Standing;House hold activities    How long can you stand comfortably?  10 minutes/5 min in one place    Diagnostic tests  x-rays    Patient Stated Goals  have less pain, work without pain    Currently in Pain?  Yes    Pain Score  3     Pain Location  Back    Pain Orientation  Lower    Pain Descriptors / Indicators  Aching                       [facility] Adult PT Treatment/Exercise - 10/26/17 0001      Self-Care   Self-Care  ADL's    ADL's  discussed various options to modify lifting of her patient to minimize strain on back      Exercises   Exercises  Lumbar      Lumbar Exercises: Aerobic   Nustep  L 5 7 min      Lumbar Exercises: Standing   Other Standing Lumbar Exercises  marching x 15 with BUE suppport full range to fatigue       Lumbar Exercises: Seated   Other Seated Lumbar Exercises  resisted hip flex with red x 20; then end range no resistance x 30 ea      Modalities   Modalities  Moist Heat;Electrical Stimulation      Moist Heat Therapy   Number Minutes Moist Heat  15 Minutes    Moist Heat Location  Lumbar Spine   and glutes     Electrical Stimulation   Electrical Stimulation Location  lumbar/gluts    Electrical Stimulation Action  IFC     Electrical Stimulation Parameters  prone    Electrical Stimulation Goals  Pain      Manual Therapy   Manual Therapy  Soft tissue mobilization    Soft tissue mobilization  to bil lumbar and gluteals       Trigger Point Dry Needling - 10/26/17 1543    Consent Given?  Yes    Muscles Treated Upper Body  Longissimus;Quadratus Lumborum    Muscles Treated Lower Body  Gluteus minimus   and  glut med bil   Longissimus Response  Palpable increased muscle length    Gluteus Minimus Response  Twitch response elicited;Palpable increased muscle length             PT Short Term Goals - 10/12/17 1001      PT SHORT TERM GOAL #1   Title  independent with initial HEP    Status  Achieved        PT Long Term Goals - 10/26/17 1318      PT LONG TERM GOAL #1   Title  understand proper posture and body mechanics    Time  8    Period  Weeks    Status  Partially Met      PT LONG TERM GOAL #2   Title  reports pain decreased 50%    Time  8    Period  Weeks    Status  Partially Met      PT LONG TERM GOAL #3   Title  normal lumbar ROM    Time  8    Period  Weeks    Status  Achieved      PT LONG TERM GOAL #4   Title  go up and down stairs without fear    Time  8    Period  Weeks    Status  Partially Met            Plan - 10/26/17 1548    Clinical Impression Statement  Patient had significant relief with DN and responded well again today with less tone noted in paraspinals. Lumbar ROM goal was met.     Rehab Potential  Good    PT Frequency  2x / week     PT Duration  8 weeks    PT Treatment/Interventions  ADLs/Self Care Home Management;Electrical Stimulation;Moist Heat;Traction;Therapeutic activities;Therapeutic exercise;Manual techniques;Patient/family education;Neuromuscular re-education;Dry needling    PT Next Visit Plan  core activation ex, continue DN as indicated    Consulted and Agree with Plan of Care  Patient       Patient will benefit from skilled therapeutic intervention in order to improve the following deficits and impairments:  Impaired tone, Decreased activity tolerance, Decreased strength, Pain, Increased muscle spasms, Improper body mechanics, Decreased range of motion, Decreased safety awareness, Impaired flexibility, Postural dysfunction  Visit Diagnosis: Acute bilateral low back pain with bilateral sciatica  Muscle spasm of back     Problem List There are no active problems to display for this patient.   Becky Carney PT 10/26/2017, 3:52 PM  [place] [address] [place], [place], [address] Phone: [phone]   Fax:  [phone]  Name: Becky Carney MRN: [id number] Date of Birth: 14-Sep-1960

## 2017-10-31 ENCOUNTER — Encounter: Payer: Self-pay | Admitting: Physical Therapy

## 2017-10-31 ENCOUNTER — Ambulatory Visit: Payer: BLUE CROSS/BLUE SHIELD | Admitting: Physical Therapy

## 2017-10-31 DIAGNOSIS — M5442 Lumbago with sciatica, left side: Principal | ICD-10-CM

## 2017-10-31 DIAGNOSIS — M5441 Lumbago with sciatica, right side: Secondary | ICD-10-CM

## 2017-10-31 DIAGNOSIS — M62830 Muscle spasm of back: Secondary | ICD-10-CM

## 2017-10-31 NOTE — Therapy (Signed)
[place], [place], [address] Phone: [phone]   Fax:  [phone]  Physical Therapy Treatment  Patient Details  Name: Becky Carney MRN: [id number] Date of Birth: 1960/10/16 Referring Provider: Lynann Bologna   Encounter Date: 10/31/2017  PT End of Session - 10/31/17 1043    Visit Number  8    Date for PT Re-Evaluation  12/03/17    PT Start Time  2993    PT Stop Time  1105    PT Time Calculation (min)  50 min       Past Medical History:  Diagnosis Date  . Hypertension     Past Surgical History:  Procedure Laterality Date  . ABDOMINAL HYSTERECTOMY    . BACK SURGERY  2004    There were no vitals filed for this visit.  Subjective Assessment - 10/31/17 1010    Subjective  DN didn't last as long last time- nothing last. broke toe so effecting back with limp. hips always hurt. copays are killing me and can not afford shots    Currently in Pain?  Yes    Pain Score  5     Pain Location  Back    Pain Orientation  Lower                       [facility] Adult PT Treatment/Exercise - 10/31/17 0001      Exercises   Exercises  Lumbar      Lumbar Exercises: Aerobic   Nustep  L 5 7 min      Lumbar Exercises: Standing   Other Standing Lumbar Exercises  wt ball OH ext and obl 15 each    Other Standing Lumbar Exercises  hip ext and abd 2 sets 10      Lumbar Exercises: Supine   Other Supine Lumbar Exercises  core stab 15 min      Modalities   Modalities  Traction      Traction   Type of Traction  Lumbar    Min (lbs)  60    Max (lbs)  75    Hold Time  60    Rest Time  20    Time  15      Manual Therapy   Manual Therapy  Passive ROM    Passive ROM  LE and trunk               PT Short Term Goals - 10/12/17 1001      PT SHORT TERM GOAL #1   Title  independent with initial HEP    Status  Achieved        PT Long Term Goals - 10/31/17 1036      PT LONG TERM  GOAL #1   Title  understand proper posture and body mechanics    Status  Achieved      PT LONG TERM GOAL #2   Title  reports pain decreased 50%    Status  Partially Met      PT LONG TERM GOAL #3   Title  normal lumbar ROM    Status  Achieved      PT LONG TERM GOAL #4   Title  go up and down stairs without fear    Status  Partially Met            Plan - 10/31/17 1049    Clinical Impression Statement  pt frustrated with lack  of progress and no lasting pain relief. pt c/o high copay and unsure what to do. trial of traction today as this is last option to try for pain relief and pt is having radiating pain into hips. slow progress towards goals. focus session on supien core activation ex and pt verb doing these in past as HEP and they were helpful.    PT Treatment/Interventions  ADLs/Self Care Home Management;Electrical Stimulation;Moist Heat;Traction;Therapeutic activities;Therapeutic exercise;Manual techniques;Patient/family education;Neuromuscular re-education;Dry needling    PT Next Visit Plan  assess        Patient will benefit from skilled therapeutic intervention in order to improve the following deficits and impairments:  Impaired tone, Decreased activity tolerance, Decreased strength, Pain, Increased muscle spasms, Improper body mechanics, Decreased range of motion, Decreased safety awareness, Impaired flexibility, Postural dysfunction  Visit Diagnosis: Acute bilateral low back pain with bilateral sciatica  Muscle spasm of back     Problem List There are no active problems to display for this patient.   Wynema Garoutte,ANGIE PTA 10/31/2017, 11:06 AM  [place], [place], [address] Phone: [phone]   Fax:  [phone]  Name: LANNIE HEAPS MRN: [id number] Date of Birth: [birth year]

## 2017-11-02 ENCOUNTER — Ambulatory Visit: Payer: BLUE CROSS/BLUE SHIELD | Admitting: Physical Therapy

## 2017-11-02 ENCOUNTER — Encounter: Payer: Self-pay | Admitting: Physical Therapy

## 2017-11-02 DIAGNOSIS — M5442 Lumbago with sciatica, left side: Principal | ICD-10-CM

## 2017-11-02 DIAGNOSIS — M5441 Lumbago with sciatica, right side: Secondary | ICD-10-CM

## 2017-11-02 DIAGNOSIS — M62830 Muscle spasm of back: Secondary | ICD-10-CM

## 2017-11-02 NOTE — Therapy (Signed)
[facility] [address] [place], [place], [address] Phone: [phone]   Fax:  [phone]  Physical Therapy Treatment  Patient Details  Name: Becky Carney MRN: [id number] Date of Birth: 12/07/1960 Referring Provider: voytek   Encounter Date: 11/02/2017  PT End of Session - 11/02/17 1432    Visit Number  9    Date for PT Re-Evaluation  12/03/17    PT Start Time  1400    PT Stop Time  1450    PT Time Calculation (min)  50 min       Past Medical History:  Diagnosis Date  . Hypertension     Past Surgical History:  Procedure Laterality Date  . ABDOMINAL HYSTERECTOMY    . BACK SURGERY  2004    There were no vitals filed for this visit.  Subjective Assessment - 11/02/17 1358    Subjective  traction worked better than anything    Currently in Pain?  Yes    Pain Score  2     Pain Location  Back    Pain Orientation  Lower                       [facility] Adult PT Treatment/Exercise - 11/02/17 0001      Lumbar Exercises: Aerobic   Nustep  L 5 7 min      Lumbar Exercises: Standing   Other Standing Lumbar Exercises  hip ext and abd 15 red tband   green tband row,shld ext and trunk ext 15 each     Lumbar Exercises: Supine   Other Supine Lumbar Exercises  core stab 15 min      Traction   Type of Traction  Lumbar    Min (lbs)  60    Max (lbs)  75    Hold Time  60    Rest Time  20    Time  15               PT Short Term Goals - 10/12/17 1001      PT SHORT TERM GOAL #1   Title  independent with initial HEP    Status  Achieved        PT Long Term Goals - 10/31/17 1036      PT LONG TERM GOAL #1   Title  understand proper posture and body mechanics    Status  Achieved      PT LONG TERM GOAL #2   Title  reports pain decreased 50%    Status  Partially Met      PT LONG TERM GOAL #3   Title  normal lumbar ROM    Status  Achieved      PT LONG TERM GOAL #4   Title  go up  and down stairs without fear    Status  Partially Met            Plan - 11/02/17 1432    Clinical Impression Statement  pt tolerated ther ex well, better activation with core with less cuing. pt feeling hopefully with traction    PT Treatment/Interventions  ADLs/Self Care Home Management;Electrical Stimulation;Moist Heat;Traction;Therapeutic activities;Therapeutic exercise;Manual techniques;Patient/family education;Neuromuscular re-education;Dry needling    PT Next Visit Plan  progress core stab and traction       Patient will benefit from skilled therapeutic intervention in order to improve the following deficits and impairments:  Impaired tone, Decreased activity tolerance, Decreased strength,   Pain, Increased muscle spasms, Improper body mechanics, Decreased range of motion, Decreased safety awareness, Impaired flexibility, Postural dysfunction  Visit Diagnosis: Acute bilateral low back pain with bilateral sciatica  Muscle spasm of back     Problem List There are no active problems to display for this patient.   PAYSEUR,ANGIE PTA 11/02/2017, 2:33 PM  [place] [address] [place], [place], [address] Phone: [phone]   Fax:  [phone]  Name: Becky Carney MRN: [id number] Date of Birth: 26-Mar-1960

## 2017-11-07 ENCOUNTER — Ambulatory Visit: Payer: BLUE CROSS/BLUE SHIELD | Admitting: Physical Therapy

## 2017-11-07 ENCOUNTER — Encounter: Payer: Self-pay | Admitting: Physical Therapy

## 2017-11-07 DIAGNOSIS — M5442 Lumbago with sciatica, left side: Secondary | ICD-10-CM | POA: Diagnosis not present

## 2017-11-07 DIAGNOSIS — M62830 Muscle spasm of back: Secondary | ICD-10-CM

## 2017-11-07 DIAGNOSIS — M5441 Lumbago with sciatica, right side: Secondary | ICD-10-CM

## 2017-11-07 NOTE — Therapy (Signed)
[address] [place], [place], [address] Phone: [phone]   Fax:  [phone] Progress Note Reporting Period 10/03/17 to 11/07/17 for the first 10 visits  See note below for Objective Data and Assessment of Progress/Goals.      Physical Therapy Treatment  Patient Details  Name: Becky Carney MRN: [id number] Date of Birth: 1960/07/18 Referring Provider: Lynann Bologna   Encounter Date: 11/07/2017  PT End of Session - 11/07/17 0809    Visit Number  10    Date for PT Re-Evaluation  12/03/17    PT Start Time  0800    PT Stop Time  1696    PT Time Calculation (min)  55 min       Past Medical History:  Diagnosis Date  . Hypertension     Past Surgical History:  Procedure Laterality Date  . ABDOMINAL HYSTERECTOMY    . BACK SURGERY  2004    There were no vitals filed for this visit.  Subjective Assessment - 11/07/17 0807    Subjective  traction really helping. " I would 75% better. Worked 14 hours yesterday and did surprisingly well    Currently in Pain?  No/denies                       [facility] Adult PT Treatment/Exercise - 11/07/17 0001      Lumbar Exercises: Aerobic   Nustep  L 5 7 min      Lumbar Exercises: Standing   Other Standing Lumbar Exercises  hip ext and abd 15 red tband   green tband row, shld ext and trunk ext 15 each     Lumbar Exercises: Supine   Other Supine Lumbar Exercises  core stab 15 min      Traction   Type of Traction  Lumbar    Min (lbs)  70    Max (lbs)  85    Hold Time  60    Rest Time  20    Time  15             PT Education - 11/07/17 0818    Education Details  scap stab green, hip strength red tband    Person(s) Educated  Patient    Methods  Explanation;Demonstration;Handout    Comprehension  Verbalized understanding;Returned demonstration       PT Short Term Goals - 10/12/17 1001      PT SHORT TERM GOAL #1   Title  independent  with initial HEP    Status  Achieved        PT Long Term Goals - 11/07/17 0810      PT LONG TERM GOAL #2   Title  reports pain decreased 50%    Status  Partially Met      PT LONG TERM GOAL #4   Title  go up and down stairs without fear    Status  Partially Met            Plan - 11/07/17 0809    Clinical Impression Statement  pt responding very well to traction and tolerating stab and core ex better. pt will be OOT next week caring for pt on family vacation. progressing with goals    PT Treatment/Interventions  ADLs/Self Care Home Management;Electrical Stimulation;Moist Heat;Traction;Therapeutic activities;Therapeutic exercise;Manual techniques;Patient/family education;Neuromuscular re-education;Dry needling    PT Next Visit Plan  progress core stab and traction       Patient will  benefit from skilled therapeutic intervention in order to improve the following deficits and impairments:  Impaired tone, Decreased activity tolerance, Decreased strength, Pain, Increased muscle spasms, Improper body mechanics, Decreased range of motion, Decreased safety awareness, Impaired flexibility, Postural dysfunction  Visit Diagnosis: Acute bilateral low back pain with bilateral sciatica  Muscle spasm of back     Problem List There are no active problems to display for this patient.   Nayda Riesen,ANGIE PTA 11/07/2017, 8:19 AM  [address] [place], [place], [address] Phone: [phone]   Fax:  [phone]  Name: MARGUARITE MARKOV MRN: [id number] Date of Birth: 22-Sep-1960

## 2017-11-09 ENCOUNTER — Ambulatory Visit: Payer: BLUE CROSS/BLUE SHIELD | Admitting: Physical Therapy

## 2017-11-09 DIAGNOSIS — M62830 Muscle spasm of back: Secondary | ICD-10-CM

## 2017-11-09 DIAGNOSIS — M5442 Lumbago with sciatica, left side: Principal | ICD-10-CM

## 2017-11-09 DIAGNOSIS — M5441 Lumbago with sciatica, right side: Secondary | ICD-10-CM

## 2017-11-09 NOTE — Therapy (Signed)
[address] [place], [place], [address] Phone: [phone]   Fax:  [phone]  Physical Therapy Treatment  Patient Details  Name: Becky Carney MRN: [id number] Date of Birth: Jul 05, 1960 Referring Provider (PT): voytek   Encounter Date: 11/09/2017  PT End of Session - 11/09/17 1649    Visit Number  11    Date for PT Re-Evaluation  12/03/17    PT Start Time  4081    PT Stop Time  1710    PT Time Calculation (min)  55 min       Past Medical History:  Diagnosis Date  . Hypertension     Past Surgical History:  Procedure Laterality Date  . ABDOMINAL HYSTERECTOMY    . BACK SURGERY  2004    There were no vitals filed for this visit.  Subjective Assessment - 11/09/17 1624    Subjective  alot of errands today , in/out car and groceries and doing well    Currently in Pain?  Yes    Pain Score  2                        [facility] Adult PT Treatment/Exercise - 11/09/17 0001      Exercises   Exercises  Lumbar;Knee/Hip      Lumbar Exercises: Aerobic   Nustep  L 5 7 min      Lumbar Exercises: Machines for Strengthening   Cybex Lumbar Extension  black tband 2 sets 15   trunk flexion 2 sets 15   Other Lumbar Machine Exercise  rows and lats 20# 2 sets 15      Lumbar Exercises: Supine   Bridge with [employer]  15 reps    Bridge with clamshell  15 reps    Other Supine Lumbar Exercises  core stab 10 min      Knee/Hip Exercises: Standing   Walking with Sports Cord  30# 5 times fwd/back 3 times each side      Traction   Type of Traction  Lumbar   TABLE LOCKED   Min (lbs)  70    Max (lbs)  85    Hold Time  60    Rest Time  20    Time  15               PT Short Term Goals - 10/12/17 1001      PT SHORT TERM GOAL #1   Title  independent with initial HEP    Status  Achieved        PT Long Term Goals - 11/09/17 1650      PT LONG TERM GOAL #2   Title  reports pain  decreased 50%    Status  Partially Met      PT LONG TERM GOAL #3   Title  normal lumbar ROM      PT LONG TERM GOAL #4   Title  go up and down stairs without fear    Status  Partially Met            Plan - 11/09/17 1651    Clinical Impression Statement  pt is getting excellent reflief with traction and it is carried over into func activities. tolerating core stab ex well in clinic. pt will be on vacation next week carring fo rpt so will asses at return    PT Treatment/Interventions  ADLs/Self Care Home Management;Electrical Stimulation;Moist Heat;Traction;Therapeutic activities;Therapeutic  exercise;Manual techniques;Patient/family education;Neuromuscular re-education;Dry needling    PT Next Visit Plan  assess       Patient will benefit from skilled therapeutic intervention in order to improve the following deficits and impairments:  Impaired tone, Decreased activity tolerance, Decreased strength, Pain, Increased muscle spasms, Improper body mechanics, Decreased range of motion, Decreased safety awareness, Impaired flexibility, Postural dysfunction  Visit Diagnosis: Acute bilateral low back pain with bilateral sciatica  Muscle spasm of back     Problem List There are no active problems to display for this patient.   Jaran Sainz,ANGIE PTA 11/09/2017, 4:53 PM  [address] [place], [place], [address] Phone: [phone]   Fax:  [phone]  Name: Becky Carney MRN: [id number] Date of Birth: 1960/10/16

## 2017-11-21 ENCOUNTER — Ambulatory Visit: Payer: BLUE CROSS/BLUE SHIELD | Attending: Orthopedic Surgery | Admitting: Physical Therapy

## 2017-11-21 DIAGNOSIS — M5441 Lumbago with sciatica, right side: Secondary | ICD-10-CM | POA: Diagnosis present

## 2017-11-21 DIAGNOSIS — M5442 Lumbago with sciatica, left side: Secondary | ICD-10-CM | POA: Insufficient documentation

## 2017-11-21 DIAGNOSIS — M62830 Muscle spasm of back: Secondary | ICD-10-CM

## 2017-11-21 NOTE — Therapy (Signed)
[address] [place], [place], [address] Phone: [phone]   Fax:  [phone]  Physical Therapy Treatment  Patient Details  Name: Becky Carney MRN: [id number] Date of Birth: Oct 03, 1960 Referring Provider (PT): voytek   Encounter Date: 11/21/2017  PT End of Session - 11/21/17 0951    Visit Number  12    Date for PT Re-Evaluation  12/03/17    PT Start Time  0930    PT Stop Time  1020    PT Time Calculation (min)  50 min       Past Medical History:  Diagnosis Date  . Hypertension     Past Surgical History:  Procedure Laterality Date  . ABDOMINAL HYSTERECTOMY    . BACK SURGERY  2004    There were no vitals filed for this visit.  Subjective Assessment - 11/21/17 0935    Subjective  back is 70% better.    Currently in Pain?  Yes    Pain Score  2     Pain Location  Back         [facility] PT Assessment - 11/21/17 0001      AROM   Overall AROM Comments  Lumbar WNLs                   [facility] Adult PT Treatment/Exercise - 11/21/17 0001      Lumbar Exercises: Machines for Strengthening   Cybex Lumbar Extension  black tband 2 sets 15   trunk flex and ext   Other Lumbar Machine Exercise  rows and lats 20# 2 sets 15      Lumbar Exercises: Standing   Other Standing Lumbar Exercises  wt ball OH ext and obl 15 each    Other Standing Lumbar Exercises  hip ext and abd 15 red tband   pulley scap stab     Lumbar Exercises: Supine   Ab Set  15 reps;3 seconds    Other Supine Lumbar Exercises  core stab 10 min      Traction   Type of Traction  Lumbar    Min (lbs)  70    Max (lbs)  85    Hold Time  60    Rest Time  20    Time  15               PT Short Term Goals - 10/12/17 1001      PT SHORT TERM GOAL #1   Title  independent with initial HEP    Status  Achieved        PT Long Term Goals - 11/21/17 0948      PT LONG TERM GOAL #1   Title  understand proper posture  and body mechanics    Status  Achieved      PT LONG TERM GOAL #2   Title  reports pain decreased 50%    Status  Achieved      PT LONG TERM GOAL #3   Title  normal lumbar ROM    Status  Achieved      PT LONG TERM GOAL #4   Title  go up and down stairs without fear    Baseline  heavy use UE and after being at beach doing steps hurt RT knee    Status  Partially Met            Plan - 11/21/17 0950    Clinical Impression Statement  pain 70% better and all goals met except step goal , pt with knee pain today so did not try. continued to focus on core activation    PT Treatment/Interventions  ADLs/Self Care Home Management;Electrical Stimulation;Moist Heat;Traction;Therapeutic activities;Therapeutic exercise;Manual techniques;Patient/family education;Neuromuscular re-education;Dry needling    PT Next Visit Plan  STAIRS, core activation. progress towards D/C       Patient will benefit from skilled therapeutic intervention in order to improve the following deficits and impairments:  Impaired tone, Decreased activity tolerance, Decreased strength, Pain, Increased muscle spasms, Improper body mechanics, Decreased range of motion, Decreased safety awareness, Impaired flexibility, Postural dysfunction  Visit Diagnosis: Acute bilateral low back pain with bilateral sciatica  Muscle spasm of back     Problem List There are no active problems to display for this patient.   Freddy Kinne,ANGIE  PTA 11/21/2017, 10:08 AM  [address] [place], [place], [address] Phone: [phone]   Fax:  [phone]  Name: ARELIE KUZEL MRN: [id number] Date of Birth: [birth year]

## 2017-11-23 ENCOUNTER — Ambulatory Visit: Payer: BLUE CROSS/BLUE SHIELD | Admitting: Physical Therapy

## 2017-11-23 DIAGNOSIS — M5442 Lumbago with sciatica, left side: Secondary | ICD-10-CM | POA: Diagnosis not present

## 2017-11-23 DIAGNOSIS — M5441 Lumbago with sciatica, right side: Secondary | ICD-10-CM

## 2017-11-23 DIAGNOSIS — M62830 Muscle spasm of back: Secondary | ICD-10-CM

## 2017-11-23 NOTE — Therapy (Signed)
[address] [place], [place], [address] Phone: [phone]   Fax:  [phone]  Physical Therapy Treatment  Patient Details  Name: Becky Carney MRN: [id number] Date of Birth: 1960-05-03 Referring Provider (PT): voytek   Encounter Date: 11/23/2017  PT End of Session - 11/23/17 1032    Visit Number  13    Date for PT Re-Evaluation  12/03/17    PT Start Time  4327    PT Stop Time  1105    PT Time Calculation (min)  50 min       Past Medical History:  Diagnosis Date  . Hypertension     Past Surgical History:  Procedure Laterality Date  . ABDOMINAL HYSTERECTOMY    . BACK SURGERY  2004    There were no vitals filed for this visit.  Subjective Assessment - 11/23/17 1020    Subjective  knees are killing me- still recovering from trip. I think I want to be on hold after today and see how back does    Currently in Pain?  Yes    Pain Score  2     Pain Location  Back                       [facility] Adult PT Treatment/Exercise - 11/23/17 0001      Exercises   Exercises  Lumbar;Knee/Hip      Lumbar Exercises: Machines for Strengthening   Cybex Lumbar Extension  black tband 2 sets 15   trunk flex/ext   Other Lumbar Machine Exercise  rows and lats 20# 2 sets 15      Lumbar Exercises: Standing   Other Standing Lumbar Exercises  wt ball OH ext and obl 15 each    Other Standing Lumbar Exercises  hip ext and abd 15 red tband      Lumbar Exercises: Supine   Other Supine Lumbar Exercises  core stab 10 min      Traction   Type of Traction  Lumbar    Min (lbs)  70    Max (lbs)  85    Hold Time  60    Rest Time  20    Time  15             PT Education - 11/23/17 1031    Education Details  core stab for home    Person(s) Educated  Patient    Methods  Explanation;Demonstration    Comprehension  Verbalized understanding;Returned demonstration       PT Short Term Goals -  10/12/17 1001      PT SHORT TERM GOAL #1   Title  independent with initial HEP    Status  Achieved        PT Long Term Goals - 11/23/17 1022      PT LONG TERM GOAL #1   Title  understand proper posture and body mechanics    Status  Achieved      PT LONG TERM GOAL #2   Title  reports pain decreased 50%    Status  Achieved      PT LONG TERM GOAL #3   Title  normal lumbar ROM    Status  Achieved      PT LONG TERM GOAL #4   Title  go up and down stairs without fear    Status  Partially Met  Plan - 11/23/17 1032    Clinical Impression Statement  all goals met except steps which is limited d/t knees. independant with HEP    PT Treatment/Interventions  ADLs/Self Care Home Management;Electrical Stimulation;Moist Heat;Traction;Therapeutic activities;Therapeutic exercise;Manual techniques;Patient/family education;Neuromuscular re-education;Dry needling    PT Next Visit Plan  HOLD 2-3 weeks to assure no f/u with back       Patient will benefit from skilled therapeutic intervention in order to improve the following deficits and impairments:  Impaired tone, Decreased activity tolerance, Decreased strength, Pain, Increased muscle spasms, Improper body mechanics, Decreased range of motion, Decreased safety awareness, Impaired flexibility, Postural dysfunction  Visit Diagnosis: Acute bilateral low back pain with bilateral sciatica  Muscle spasm of back     Problem List There are no active problems to display for this patient.   Shiela Bruns,ANGIE PTA 11/23/2017, 10:34 AM  [address] [place], [place], [address] Phone: [phone]   Fax:  [phone]  Name: Becky Carney MRN: [id number] Date of Birth: 1960-05-17

## 2018-01-25 ENCOUNTER — Other Ambulatory Visit: Payer: Self-pay | Admitting: Family Medicine

## 2018-01-25 DIAGNOSIS — E2839 Other primary ovarian failure: Secondary | ICD-10-CM

## 2018-01-30 ENCOUNTER — Telehealth: Payer: Self-pay

## 2018-01-30 NOTE — Telephone Encounter (Signed)
SENT REFERRAL TO SCHEDULING AND FILED NOTES 

## 2018-02-13 ENCOUNTER — Encounter: Payer: Self-pay | Admitting: Cardiology

## 2018-02-13 ENCOUNTER — Ambulatory Visit (INDEPENDENT_AMBULATORY_CARE_PROVIDER_SITE_OTHER): Payer: BLUE CROSS/BLUE SHIELD | Admitting: Cardiology

## 2018-02-13 DIAGNOSIS — I2510 Atherosclerotic heart disease of native coronary artery without angina pectoris: Secondary | ICD-10-CM | POA: Diagnosis not present

## 2018-02-13 DIAGNOSIS — I10 Essential (primary) hypertension: Secondary | ICD-10-CM | POA: Insufficient documentation

## 2018-02-13 MED ORDER — PRAVASTATIN SODIUM 20 MG PO TABS: 20.0000 mg | ORAL_TABLET | Freq: Every evening | ORAL | 6 refills | Status: DC

## 2018-02-13 NOTE — Progress Notes (Signed)
Cardiology Office Note:    Date:  02/13/2018   ID:  Becky Carney, DOB 04-20-1960, MRN [id number]  PCP:  Laurann MontanaWhite, Cynthia, MD  Cardiologist:  Norman HerrlichBrian Molina Hollenback, MD   Referring MD: Laurann MontanaWhite, Cynthia, MD  ASSESSMENT:    1. Coronary artery calcification seen on CT scan   2. Essential hypertension    PLAN:    In order of problems listed above:  1. She has subclinical atherosclerosis with coronary calcification on CT scan.  I strongly encouraged modifying risk factors smoking cessation and started on a low-dose of a low intensity statin.  We discussed the potential of an ischemia evaluation she is asymptomatic and declines at this time I think this is a reasonable approach but I asked her to contact me if she develops symptoms of exercise intolerance severe shortness of breath with physical effort or chest pain.  To follow-up with her PCP 4 to 6 months with lipid profile CMP 2. Stable blood pressure target she will continue current treatment including diuretic ARB  Next appointment as needed   Medication Adjustments/Labs and Tests Ordered: Current medicines are reviewed at length with the patient today.  Concerns regarding medicines are outlined above.  No orders of the defined types were placed in this encounter.  No orders of the defined types were placed in this encounter.    Chief Complaint  Patient presents with  . Hypertension    recent CT with CAC    History of Present Illness:    Becky ShipperVirgie A Carney is a 57 y.o. female who is being seen today for the evaluation of coronary artery calcification on CT scan at the request of Laurann MontanaWhite, Cynthia, MD.  06/29/16 Chest CT: FINDINGS: Cardiovascular: The heart is normal in size. No pericardial effusion. Three vessel coronary atherosclerosis.  She had a CT of her chest performed and was found to have three-vessel coronary artery atherosclerosis.  She has no known history of vascular disease cardiomyopathy congenital rheumatic heart disease.   Is a very vigorous active woman she works up to 14 hours a day she notices it when she walks outdoors that she is a bit breathless but is never severe or limiting no chest pain palpitation or syncope no claudication or TIA.  I reviewed with her that coronary artery calcification is not that unusual and subclinical atherosclerosis is seen in up to 50% of middle-aged individuals 4840 to 59 years old.  It is however an additional risk factor for vascular disease and really should focus us like a coronary calcium score to modify risk factors and strongly encouraged her to stop smoking gave her hands for smoking cessation and she seems committed.  Despite having a lipid profile that seems at target she would benefit from statin therapy with her coronary calcification and subclinical atherosclerosis and after nice discussion we decided to put her on a low-dose of a low intensity statin and I would like to see her LDL under 70 and ideally less than 55.  I asked her to follow-up with her PCP in 4 to 6 weeks for a lipid profile.  We discussed the potential of a screening study for ischemia at this time she declines having a stress test performed.  I asked her if she develops exercise intolerance limiting shortness of breath or chest pain to contact me.  With her history of previous pituitary hemorrhage I would not place her on aspirin.  Her hypertension is addressed and controlled.  Past Medical History:  Diagnosis Date  .  Hypertension     Past Surgical History:  Procedure Laterality Date  . ABDOMINAL HYSTERECTOMY    . ANUS SURGERY    . BACK SURGERY  2004  . EYE SURGERY      Current Medications: Current Meds  Medication Sig  . acetaminophen (TYLENOL 8 HOUR ARTHRITIS PAIN) 650 MG CR tablet Take 1,300 mg by mouth 2 (two) times daily.  . Cholecalciferol (VITAMIN D) 50 MCG (2000 UT) tablet Take 4,000 Units by mouth daily.  . Coenzyme Q10 (CO Q 10) 100 MG CAPS Take 1 capsule by mouth daily.  . DULoxetine  (CYMBALTA) 60 MG capsule Take 60 mg by mouth daily.  . furosemide (LASIX) 20 MG tablet Take 20 mg by mouth daily.  . irbesartan (AVAPRO) 150 MG tablet Take 150 mg by mouth daily.  Marland Kitchen. omeprazole (PRILOSEC) 40 MG capsule Take 40 mg by mouth daily.  Marland Kitchen. Phenylalanine 500 MG TABS Take 1,000 mg by mouth daily.  . vitamin B-12 (CYANOCOBALAMIN) 1000 MCG tablet Take 1,000 mcg by mouth daily.     Allergies:   Bupropion; Chantix [varenicline tartrate]; Fluoxetine; and Nsaids   Social History   Socioeconomic History  . Marital status: Divorced    Spouse name: Not on file  . Number of children: Not on file  . Years of education: Not on file  . Highest education level: Not on file  Occupational History  . Not on file  Social Needs  . Financial resource strain: Not on file  . Food insecurity:    Worry: Not on file    Inability: Not on file  . Transportation needs:    Medical: Not on file    Non-medical: Not on file  Tobacco Use  . Smoking status: Current Every Day Smoker    Packs/day: 1.00    Types: Cigarettes  . Smokeless tobacco: Never Used  Substance and Sexual Activity  . Alcohol use: No  . Drug use: No  . Sexual activity: Not Currently    Birth control/protection: Surgical  Lifestyle  . Physical activity:    Days per week: Not on file    Minutes per session: Not on file  . Stress: Not on file  Relationships  . Social connections:    Talks on phone: Not on file    Gets together: Not on file    Attends religious service: Not on file    Active member of club or organization: Not on file    Attends meetings of clubs or organizations: Not on file    Relationship status: Not on file  Other Topics Concern  . Not on file  Social History Narrative  . Not on file     Family History: The patient's family history includes Cancer in her paternal grandmother; Diabetes in her sister; Heart disease in her father, mother, and sister; Hypertension in her father and mother.  ROS:     Review of Systems  Constitution: Positive for chills and weight loss (12 1/2 lbs).  HENT: Negative.   Eyes: Negative.   Cardiovascular: Positive for dyspnea on exertion and leg swelling.  Respiratory: Positive for shortness of breath.   Endocrine: Negative.   Hematologic/Lymphatic: Negative.   Skin: Negative.   Musculoskeletal: Positive for back pain (spinal stenosis).  Gastrointestinal: Positive for abdominal pain.  Genitourinary: Negative.   Neurological: Negative.   Psychiatric/Behavioral: Negative.   Allergic/Immunologic: Negative.    Please see the history of present illness.   Also depression and joint pain  All other systems  reviewed and are negative.  EKGs/Labs/Other Studies Reviewed:    The following studies were reviewed today:   EKG:  EKG is  ordered today.  The ekg ordered today demonstrates [facility] and normal  Recent Labs: 05/20/2017 cholesterol 164 LDL 102 HDL 41 creatinine was normal 01/25/2018 liver function normal TSH normal   Physical Exam:    VS:  BP 122/80 (BP Location: Left Arm, Patient Position: Sitting, Cuff Size: Large)   Pulse 89   Ht 5\' 4"  (1.626 m)   Wt 238 lb 1.9 oz (108 kg)   SpO2 97%   BMI 40.87 kg/m     Wt Readings from Last 3 Encounters:  02/13/18 238 lb 1.9 oz (108 kg)  03/13/12 231 lb 12.8 oz (105.1 kg)     GEN:  Well nourished, well developed in no acute distress HEENT: Normal NECK: No JVD; No carotid bruits LYMPHATICS: No lymphadenopathy CARDIAC: RRR, no murmurs, rubs, gallops RESPIRATORY:  Clear to auscultation without rales, wheezing or rhonchi  ABDOMEN: Soft, non-tender, non-distended MUSCULOSKELETAL:  No edema; No deformity  SKIN: Warm and dry NEUROLOGIC:  Alert and oriented x 3 PSYCHIATRIC:  Normal affect     Signed, Norman Herrlich, MD  02/13/2018 2:39 PM    [facility]

## 2018-02-13 NOTE — Patient Instructions (Addendum)
Medication Instructions:  Your physician has recommended you make the following change in your medication:   START pravastatin (pravachol) 20 mg: Take 1 tablet daily in the evening with a meal  If you need a refill on your cardiac medications before your next appointment, please call your pharmacy.   Lab work: None  If you have labs (blood work) drawn today and your tests are completely normal, you will receive your results only by: Marland Kitchen. MyChart Message (if you have MyChart) OR . A paper copy in the mail If you have any lab test that is abnormal or we need to change your treatment, we will call you to review the results.  Testing/Procedures: You had an EKG today.   Follow-Up: At [facility], you and your health needs are our priority.  As part of our continuing mission to provide you with exceptional heart care, we have created designated Provider Care Teams.  These Care Teams include your primary Cardiologist (physician) and Advanced Practice Providers (APPs -  Physician Assistants and Nurse Practitioners) who all work together to provide you with the care you need, when you need it. You will need a follow up appointment as needed if symptoms worsen or fail to improve.        Coping with Quitting Smoking  Quitting smoking is a physical and mental challenge. You will face cravings, withdrawal symptoms, and temptation. Before quitting, work with your health care provider to make a plan that can help you cope. Preparation can help you quit and keep you from giving in. How can I cope with cravings? Cravings usually last for 5-10 minutes. If you get through it, the craving will pass. Consider taking the following actions to help you cope with cravings:  Keep your mouth busy: ? Chew sugar-free gum. ? Suck on hard candies or a straw. ? Brush your teeth.  Keep your hands and body busy: ? Immediately change to a different activity when you feel a craving. ? Squeeze or play with a  ball. ? Do an activity or a hobby, like making bead jewelry, practicing needlepoint, or working with wood. ? Mix up your normal routine. ? Take a short exercise break. Go for a quick walk or run up and down stairs. ? Spend time in public places where smoking is not allowed.  Focus on doing something kind or helpful for someone else.  Call a friend or family member to talk during a craving.  Join a support group.  Call a quit line, such as 1-800-QUIT-NOW.  Talk with your health care provider about medicines that might help you cope with cravings and make quitting easier for you. How can I deal with withdrawal symptoms? Your body may experience negative effects as it tries to get used to not having nicotine in the system. These effects are called withdrawal symptoms. They may include:  Feeling hungrier than normal.  Trouble concentrating.  Irritability.  Trouble sleeping.  Feeling depressed.  Restlessness and agitation.  Craving a cigarette. To manage withdrawal symptoms:  Avoid places, people, and activities that trigger your cravings.  Remember why you want to quit.  Get plenty of sleep.  Avoid coffee and other caffeinated drinks. These may worsen some of your symptoms. How can I handle social situations? Social situations can be difficult when you are quitting smoking, especially in the first few weeks. To manage this, you can:  Avoid parties, bars, and other social situations where people might be smoking.  Avoid alcohol.  Leave  right away if you have the urge to smoke.  Explain to your family and friends that you are quitting smoking. Ask for understanding and support.  Plan activities with friends or family where smoking is not an option. What are some ways I can cope with stress? Wanting to smoke may cause stress, and stress can make you want to smoke. Find ways to manage your stress. Relaxation techniques can help. For example:  Breathe slowly and deeply,  in through your nose and out through your mouth.  Listen to soothing, relaxing music.  Talk with a family member or friend about your stress.  Light a candle.  Soak in a bath or take a shower.  Think about a peaceful place. What are some ways I can prevent weight gain? Be aware that many people gain weight after they quit smoking. However, not everyone does. To keep from gaining weight, have a plan in place before you quit and stick to the plan after you quit. Your plan should include:  Having healthy snacks. When you have a craving, it may help to: ? Eat plain popcorn, crunchy carrots, celery, or other cut vegetables. ? Chew sugar-free gum.  Changing how you eat: ? Eat small portion sizes at meals. ? Eat 4-6 small meals throughout the day instead of 1-2 large meals a day. ? Be mindful when you eat. Do not watch television or do other things that might distract you as you eat.  Exercising regularly: ? Make time to exercise each day. If you do not have time for a long workout, do short bouts of exercise for 5-10 minutes several times a day. ? Do some form of strengthening exercise, like weight lifting, and some form of aerobic exercise, like running or swimming.  Drinking plenty of water or other low-calorie or no-calorie drinks. Drink 6-8 glasses of water daily, or as much as instructed by your health care provider. Summary  Quitting smoking is a physical and mental challenge. You will face cravings, withdrawal symptoms, and temptation to smoke again. Preparation can help you as you go through these challenges.  You can cope with cravings by keeping your mouth busy (such as by chewing gum), keeping your body and hands busy, and making calls to family, friends, or a helpline for people who want to quit smoking.  You can cope with withdrawal symptoms by avoiding places where people smoke, avoiding drinks with caffeine, and getting plenty of rest.  Ask your health care provider about  the different ways to prevent weight gain, avoid stress, and handle social situations. This information is not intended to replace advice given to you by your health care provider. Make sure you discuss any questions you have with your health care provider. Document Released: 01/29/2016 Document Revised: 01/29/2016 Document Reviewed: 01/29/2016 Elsevier Interactive Patient Education  2019 [employer]

## 2018-02-20 ENCOUNTER — Telehealth: Payer: Self-pay | Admitting: Cardiology

## 2018-02-20 NOTE — Telephone Encounter (Signed)
Patient states doctor put her on a statin for leg cramps and it is not working so she stopped as he requested. Just a [place]

## 2018-02-21 NOTE — Telephone Encounter (Signed)
Patient states that after taking pravastatin for 4 days she had "horrific leg cramps" that last several days after stopping pravastatin.  She has not restarted pravastatin and does not plan on restarting.    Please advise.

## 2018-02-22 NOTE — Telephone Encounter (Signed)
Stay off her statin

## 2018-02-22 NOTE — Telephone Encounter (Signed)
Please advise 

## 2018-02-23 NOTE — Telephone Encounter (Signed)
Patient informed to stop taking pravastatin. Patient verbalized understanding. No further questions.

## 2018-03-13 ENCOUNTER — Other Ambulatory Visit: Payer: Self-pay | Admitting: Family Medicine

## 2018-03-13 DIAGNOSIS — Z1231 Encounter for screening mammogram for malignant neoplasm of breast: Secondary | ICD-10-CM

## 2018-04-17 ENCOUNTER — Ambulatory Visit
Admission: RE | Admit: 2018-04-17 | Discharge: 2018-04-17 | Disposition: A | Discharge status: Home / Self Care | Payer: BLUE CROSS/BLUE SHIELD | Source: Ambulatory Visit | Attending: Family Medicine | Admitting: Family Medicine

## 2018-04-17 DIAGNOSIS — Z1231 Encounter for screening mammogram for malignant neoplasm of breast: Secondary | ICD-10-CM

## 2018-04-17 DIAGNOSIS — E2839 Other primary ovarian failure: Secondary | ICD-10-CM

## 2019-03-12 ENCOUNTER — Other Ambulatory Visit: Payer: Self-pay | Admitting: Family Medicine

## 2019-03-12 DIAGNOSIS — Z1231 Encounter for screening mammogram for malignant neoplasm of breast: Secondary | ICD-10-CM

## 2019-03-14 ENCOUNTER — Encounter: Payer: Self-pay | Admitting: Pulmonary Disease

## 2019-03-14 ENCOUNTER — Other Ambulatory Visit: Payer: Self-pay

## 2019-03-14 ENCOUNTER — Ambulatory Visit: Payer: BLUE CROSS/BLUE SHIELD | Admitting: Pulmonary Disease

## 2019-03-14 VITALS — BP 122/70 | HR 72 | Temp 97.3°F | Ht 64.0 in | Wt 239.0 lb

## 2019-03-14 DIAGNOSIS — R918 Other nonspecific abnormal finding of lung field: Secondary | ICD-10-CM | POA: Diagnosis not present

## 2019-03-14 NOTE — Progress Notes (Signed)
Becky Carney    [id number]    [birth year]  Primary Care Physician:White, Aram Beecham, MD  Referring Physician: Laurann Montana, MD [phone] [place],  [place] [address]  Chief complaint: Consult for lung cancer screening  HPI: 59 year old smoker with hypertension, allergies Referred for lung cancer screening by her primary care Patient has no symptoms at present.  Denies any dyspnea, cough, sputum production, hemoptysis, fevers, chills.  Follows with Dr. Dulce Sellar, cardiology and Dr. Cliffton Asters, primary care.  Reviewed the clinic notes which show she has subclinical atherosclerosis, smoking cessation advised.  Pets: No pets Occupation: Used to work in Marine scientist, Pensions consultant jobs.  She has been LPN for the past 6 years Exposures: No known exposures.  No mold, hot tub, Jacuzzi.  No down pillows or comforters Smoking history: 60 to 80 pack year smoker.  Continues to smoke up to 2 packs a day Travel history: No significant travel history Relevant family history: No significant family history of lung disease  Outpatient Encounter Medications as of 03/14/2019  Medication Sig  . acetaminophen (TYLENOL 8 HOUR ARTHRITIS PAIN) 650 MG CR tablet Take 1,300 mg by mouth 2 (two) times daily.  . Cholecalciferol (VITAMIN D) 50 MCG (2000 UT) tablet Take 4,000 Units by mouth daily.  . Coenzyme Q10 (CO Q 10) 100 MG CAPS Take 1 capsule by mouth daily.  . DULoxetine (CYMBALTA) 60 MG capsule Take 60 mg by mouth daily.  . furosemide (LASIX) 20 MG tablet Take 20 mg by mouth daily.  . irbesartan (AVAPRO) 150 MG tablet Take 150 mg by mouth daily.  Marland Kitchen omeprazole (PRILOSEC) 40 MG capsule Take 40 mg by mouth daily.  Marland Kitchen Phenylalanine 500 MG TABS Take 1,000 mg by mouth daily.  . vitamin B-12 (CYANOCOBALAMIN) 1000 MCG tablet Take 1,000 mcg by mouth daily.   No facility-administered encounter medications on file as of 03/14/2019.    Allergies as of 03/14/2019 - Review Complete  03/14/2019  Allergen Reaction Noted  . Bupropion Itching 03/13/2012  . Chantix [varenicline tartrate] Itching 02/13/2018  . Fluoxetine Itching 09/30/2013  . Nsaids Other (See Comments) 02/13/2018    Past Medical History:  Diagnosis Date  . Hypertension     Past Surgical History:  Procedure Laterality Date  . ABDOMINAL HYSTERECTOMY    . ANUS SURGERY    . BACK SURGERY  2004  . EYE SURGERY      Family History  Problem Relation Age of Onset  . Heart disease Mother   . Hypertension Mother   . Hypertension Father   . Heart disease Father   . Diabetes Sister   . Heart disease Sister   . Cancer Paternal Grandmother        cervical  . Breast cancer Neg Hx     Social History   Socioeconomic History  . Marital status: Divorced    Spouse name: Not on file  . Number of children: Not on file  . Years of education: Not on file  . Highest education level: Not on file  Occupational History  . Not on file  Tobacco Use  . Smoking status: Current Every Day Smoker    Packs/day: 2.00    Types: Cigarettes  . Smokeless tobacco: Never Used  Substance and Sexual Activity  . Alcohol use: No  . Drug use: No  . Sexual activity: Not Currently    Birth control/protection: Surgical  Other Topics Concern  . Not on file  Social  History Narrative  . Not on file   Social Determinants of Health   Financial Resource Strain:   . Difficulty of Paying Living Expenses: Not on file  Food Insecurity:   . Worried About Charity fundraiser in the Last Year: Not on file  . Ran Out of Food in the Last Year: Not on file  Transportation Needs:   . Lack of Transportation (Medical): Not on file  . Lack of Transportation (Non-Medical): Not on file  Physical Activity:   . Days of Exercise per Week: Not on file  . Minutes of Exercise per Session: Not on file  Stress:   . Feeling of Stress : Not on file  Social Connections:   . Frequency of Communication with Friends and Family: Not on file  .  Frequency of Social Gatherings with Friends and Family: Not on file  . Attends Religious Services: Not on file  . Active Member of Clubs or Organizations: Not on file  . Attends Archivist Meetings: Not on file  . Marital Status: Not on file  Intimate Partner Violence:   . Fear of Current or Ex-Partner: Not on file  . Emotionally Abused: Not on file  . Physically Abused: Not on file  . Sexually Abused: Not on file    Review of systems: Review of Systems  Constitutional: Negative for fever and chills.  HENT: Negative.   Eyes: Negative for blurred vision.  Respiratory: as per HPI  Cardiovascular: Negative for chest pain and palpitations.  Gastrointestinal: Negative for vomiting, diarrhea, blood per rectum. Genitourinary: Negative for dysuria, urgency, frequency and hematuria.  Musculoskeletal: Negative for myalgias, back pain and joint pain.  Skin: Negative for itching and rash.  Neurological: Negative for dizziness, tremors, focal weakness, seizures and loss of consciousness.  Endo/Heme/Allergies: Negative for environmental allergies.  Psychiatric/Behavioral: Negative for depression, suicidal ideas and hallucinations.  All other systems reviewed and are negative.  Physical Exam: Blood pressure 122/70, pulse 72, temperature (!) 97.3 F (36.3 C), temperature source Temporal, height 5\' 4"  (1.626 m), weight 239 lb (108.4 kg), SpO2 97 %. Gen:      No acute distress HEENT:  EOMI, sclera anicteric Neck:     No masses; no thyromegaly Lungs:    Clear to auscultation bilaterally; normal respiratory effort CV:         Regular rate and rhythm; no murmurs Abd:      + bowel sounds; soft, non-tender; no palpable masses, no distension Ext:    No edema; adequate peripheral perfusion Skin:      Warm and dry; no rash Neuro: alert and oriented x 3 Psych: normal mood and affect  Data Reviewed: Imaging: CT screening 06/29/2016- Mild emphysema, 12.8 mm granuloma in the right upper lobe.   I have reviewed the images personally   Assessment:  Emphysema Currently asymptomatic.  We will schedule pulmonary function test for assessment of COPD. No need for inhalers at present  Active smoker Smoking cessation discussed in detail with patient.  She has tried Wellbutrin and Chantix in the past but had side effects of itching Nicotine patches did not work.  She is not ready to quit yet.  Time spent counseling-5 minutes.  Reassess at return visit  Recommended low-dose screening CTs of the chest but she does not want to go ahead with it.  She states that even if cancer is diagnosed she would not want treatment.  We had a long discussion about benefits of screening.  I explained that  if we are able to catch cancer at early stage then curative treatment is possible.  Patient undergoes regular mammograms and colonoscopies without issue and is okay for treatment for those cancers but for some reason does not want to get screened or treated for lung cancer We will continue discussions going forward.  Plan/Recommendations: - PFTs - Smoking cessation - Continue discussions about screening CT of the chest on return visit.  Chilton Greathouse MD [facility] 03/14/2019, 8:57 AM  CC: Laurann Montana, MD

## 2019-03-14 NOTE — Patient Instructions (Signed)
We respect your decision not to go ahead with your screening CTs of the chest We will schedule pulmonary function test to assess presence of COPD Follow-up in 6 months when we can discuss again and review test.

## 2019-04-23 ENCOUNTER — Ambulatory Visit
Admission: RE | Admit: 2019-04-23 | Discharge: 2019-04-23 | Disposition: A | Discharge status: Home / Self Care | Payer: BLUE CROSS/BLUE SHIELD | Source: Ambulatory Visit | Attending: Family Medicine | Admitting: Family Medicine

## 2019-04-23 ENCOUNTER — Other Ambulatory Visit: Payer: Self-pay

## 2019-04-23 DIAGNOSIS — Z1231 Encounter for screening mammogram for malignant neoplasm of breast: Secondary | ICD-10-CM

## 2019-04-25 ENCOUNTER — Other Ambulatory Visit: Payer: Self-pay | Admitting: Family Medicine

## 2019-04-25 DIAGNOSIS — R928 Other abnormal and inconclusive findings on diagnostic imaging of breast: Secondary | ICD-10-CM

## 2019-05-14 ENCOUNTER — Other Ambulatory Visit: Payer: Self-pay

## 2019-05-14 ENCOUNTER — Other Ambulatory Visit: Payer: Self-pay | Admitting: Family Medicine

## 2019-05-14 ENCOUNTER — Ambulatory Visit
Admission: RE | Admit: 2019-05-14 | Discharge: 2019-05-14 | Disposition: A | Discharge status: Home / Self Care | Payer: BLUE CROSS/BLUE SHIELD | Source: Ambulatory Visit | Attending: Family Medicine | Admitting: Family Medicine

## 2019-05-14 DIAGNOSIS — R921 Mammographic calcification found on diagnostic imaging of breast: Secondary | ICD-10-CM

## 2019-05-14 DIAGNOSIS — R928 Other abnormal and inconclusive findings on diagnostic imaging of breast: Secondary | ICD-10-CM

## 2019-09-09 ENCOUNTER — Other Ambulatory Visit (HOSPITAL_COMMUNITY): Payer: BLUE CROSS/BLUE SHIELD

## 2019-09-12 ENCOUNTER — Ambulatory Visit: Payer: BLUE CROSS/BLUE SHIELD | Admitting: Pulmonary Disease

## 2020-04-23 ENCOUNTER — Other Ambulatory Visit: Payer: Self-pay | Admitting: Family Medicine

## 2020-04-23 DIAGNOSIS — R921 Mammographic calcification found on diagnostic imaging of breast: Secondary | ICD-10-CM

## 2020-06-02 ENCOUNTER — Other Ambulatory Visit: Payer: Self-pay

## 2020-06-02 ENCOUNTER — Other Ambulatory Visit: Payer: Self-pay | Admitting: Family Medicine

## 2020-06-02 ENCOUNTER — Ambulatory Visit
Admission: RE | Admit: 2020-06-02 | Discharge: 2020-06-02 | Disposition: A | Discharge status: Home / Self Care | Payer: BLUE CROSS/BLUE SHIELD | Source: Ambulatory Visit | Attending: Family Medicine | Admitting: Family Medicine

## 2020-06-02 DIAGNOSIS — R921 Mammographic calcification found on diagnostic imaging of breast: Secondary | ICD-10-CM

## 2020-06-11 ENCOUNTER — Ambulatory Visit
Admission: RE | Admit: 2020-06-11 | Discharge: 2020-06-11 | Disposition: A | Discharge status: Home / Self Care | Payer: BLUE CROSS/BLUE SHIELD | Source: Ambulatory Visit | Attending: Family Medicine | Admitting: Family Medicine

## 2020-06-11 ENCOUNTER — Other Ambulatory Visit: Payer: Self-pay

## 2020-06-11 DIAGNOSIS — R921 Mammographic calcification found on diagnostic imaging of breast: Secondary | ICD-10-CM

## 2021-10-20 DIAGNOSIS — I2510 Atherosclerotic heart disease of native coronary artery without angina pectoris: Secondary | ICD-10-CM | POA: Diagnosis not present

## 2021-10-20 DIAGNOSIS — I10 Essential (primary) hypertension: Secondary | ICD-10-CM | POA: Diagnosis not present

## 2021-10-20 DIAGNOSIS — R69 Illness, unspecified: Secondary | ICD-10-CM | POA: Diagnosis not present

## 2021-10-20 DIAGNOSIS — N3281 Overactive bladder: Secondary | ICD-10-CM | POA: Diagnosis not present

## 2021-10-20 DIAGNOSIS — E785 Hyperlipidemia, unspecified: Secondary | ICD-10-CM | POA: Diagnosis not present

## 2022-03-31 DIAGNOSIS — Z6841 Body Mass Index (BMI) 40.0 and over, adult: Secondary | ICD-10-CM | POA: Diagnosis not present

## 2022-03-31 DIAGNOSIS — Z8249 Family history of ischemic heart disease and other diseases of the circulatory system: Secondary | ICD-10-CM | POA: Diagnosis not present

## 2022-03-31 DIAGNOSIS — I10 Essential (primary) hypertension: Secondary | ICD-10-CM | POA: Diagnosis not present

## 2022-03-31 DIAGNOSIS — Z720 Tobacco use: Secondary | ICD-10-CM | POA: Diagnosis not present

## 2022-03-31 DIAGNOSIS — M1990 Unspecified osteoarthritis, unspecified site: Secondary | ICD-10-CM | POA: Diagnosis not present

## 2022-03-31 DIAGNOSIS — R69 Illness, unspecified: Secondary | ICD-10-CM | POA: Diagnosis not present

## 2022-03-31 DIAGNOSIS — Z888 Allergy status to other drugs, medicaments and biological substances status: Secondary | ICD-10-CM | POA: Diagnosis not present

## 2022-03-31 DIAGNOSIS — Z823 Family history of stroke: Secondary | ICD-10-CM | POA: Diagnosis not present

## 2022-03-31 DIAGNOSIS — K5900 Constipation, unspecified: Secondary | ICD-10-CM | POA: Diagnosis not present

## 2022-03-31 DIAGNOSIS — Z886 Allergy status to analgesic agent status: Secondary | ICD-10-CM | POA: Diagnosis not present

## 2022-06-15 DIAGNOSIS — E785 Hyperlipidemia, unspecified: Secondary | ICD-10-CM | POA: Diagnosis not present

## 2022-06-15 DIAGNOSIS — I10 Essential (primary) hypertension: Secondary | ICD-10-CM | POA: Diagnosis not present

## 2022-06-15 DIAGNOSIS — E559 Vitamin D deficiency, unspecified: Secondary | ICD-10-CM | POA: Diagnosis not present

## 2022-11-04 IMAGING — MG DIGITAL DIAGNOSTIC BILAT W/ TOMO W/ CAD
6 of 10 series · 6 of 26 positions shown · non-contrast
Comparison: Previous exam(s).

CLINICAL DATA: Patient for re-evaluation of left breast
calcifications.

EXAM:
DIGITAL DIAGNOSTIC BILATERAL MAMMOGRAM WITH TOMOSYNTHESIS AND CAD
TECHNIQUE: Bilateral digital diagnostic mammography and breast tomosynthesis
was performed. The images were evaluated with computer-aided
detection.

[L ML]
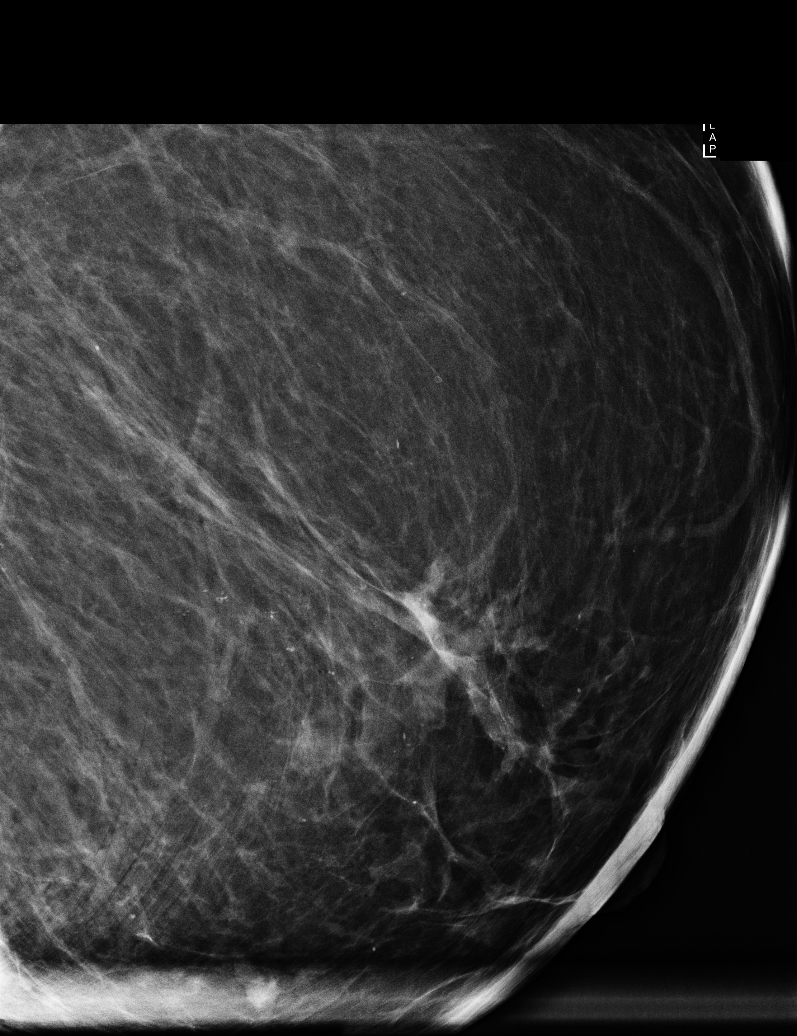

[L CC]
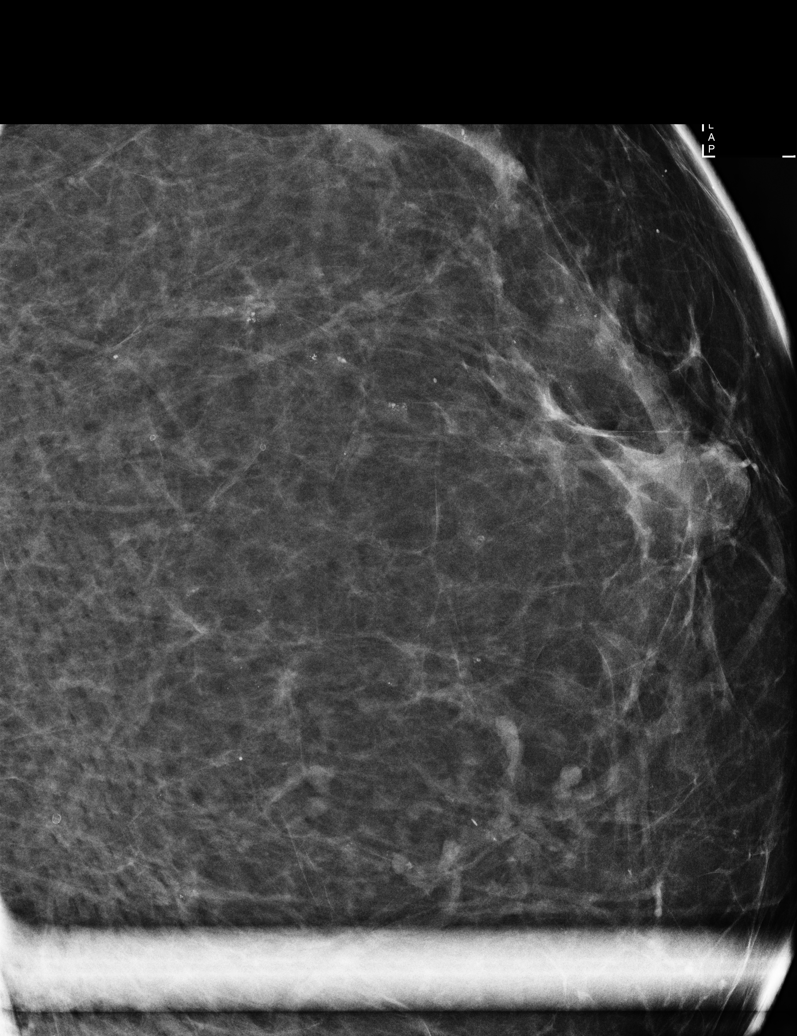

[R CC synth-2D]
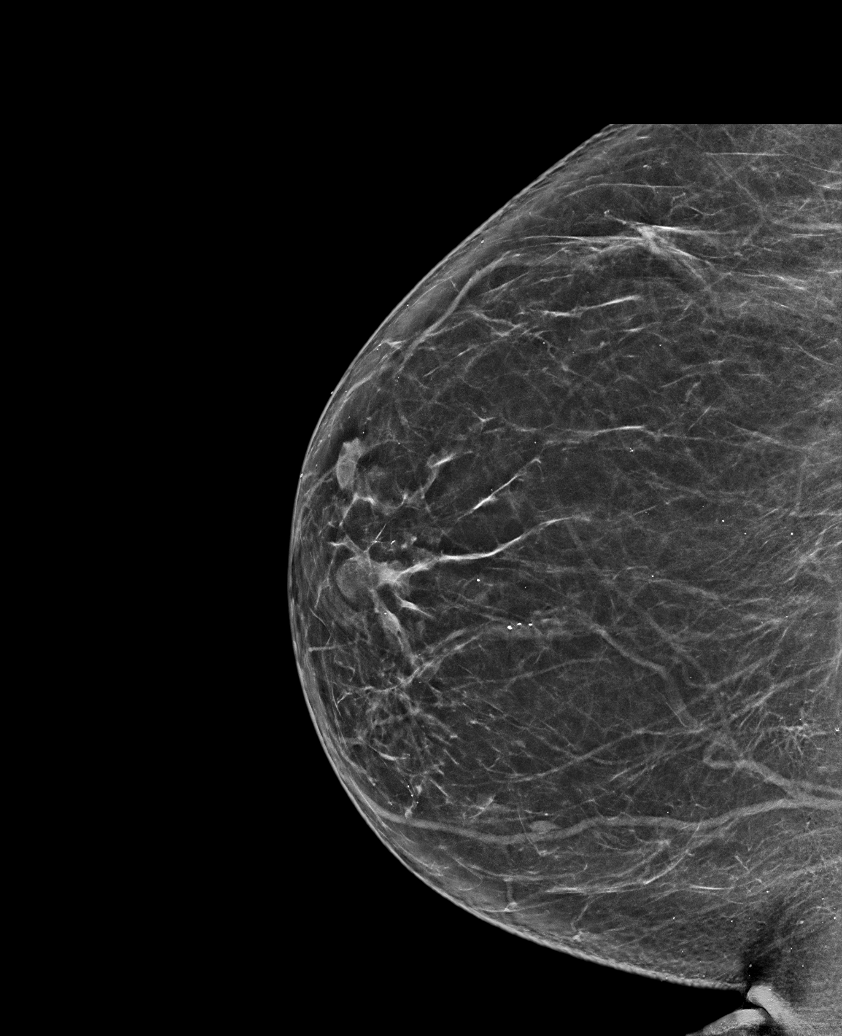

[R MLO synth-2D]
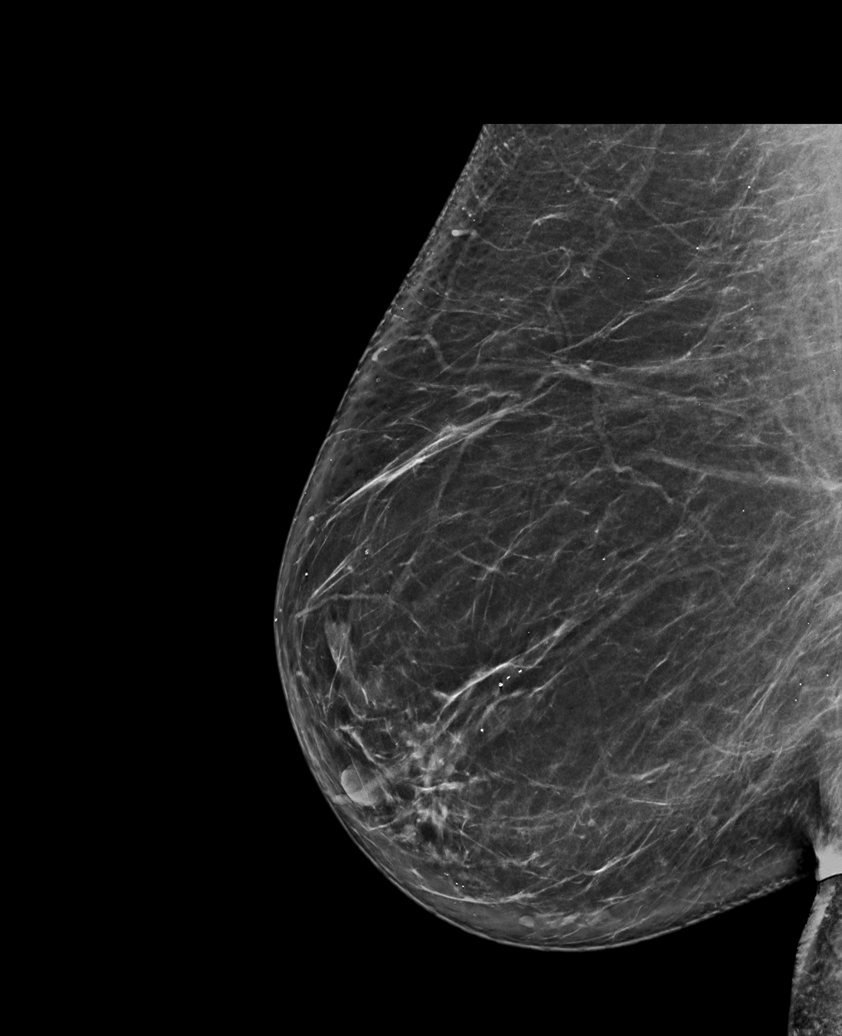

[L CC synth-2D]
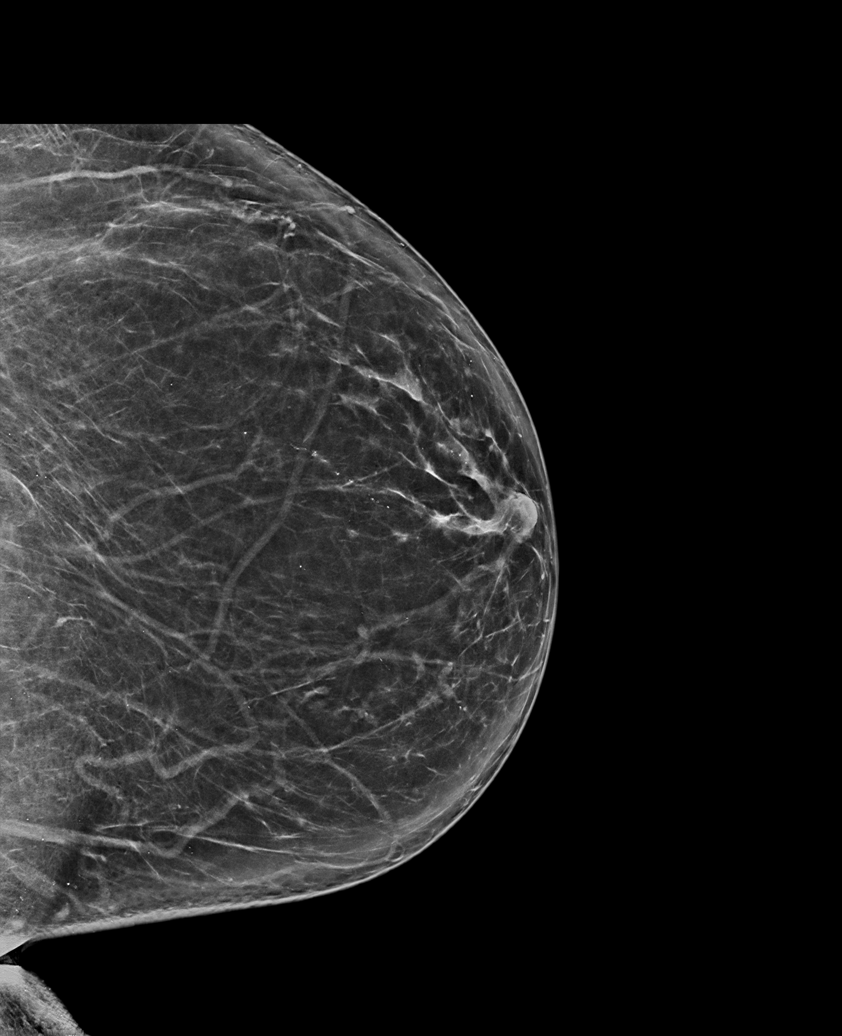

[L MLO synth-2D]
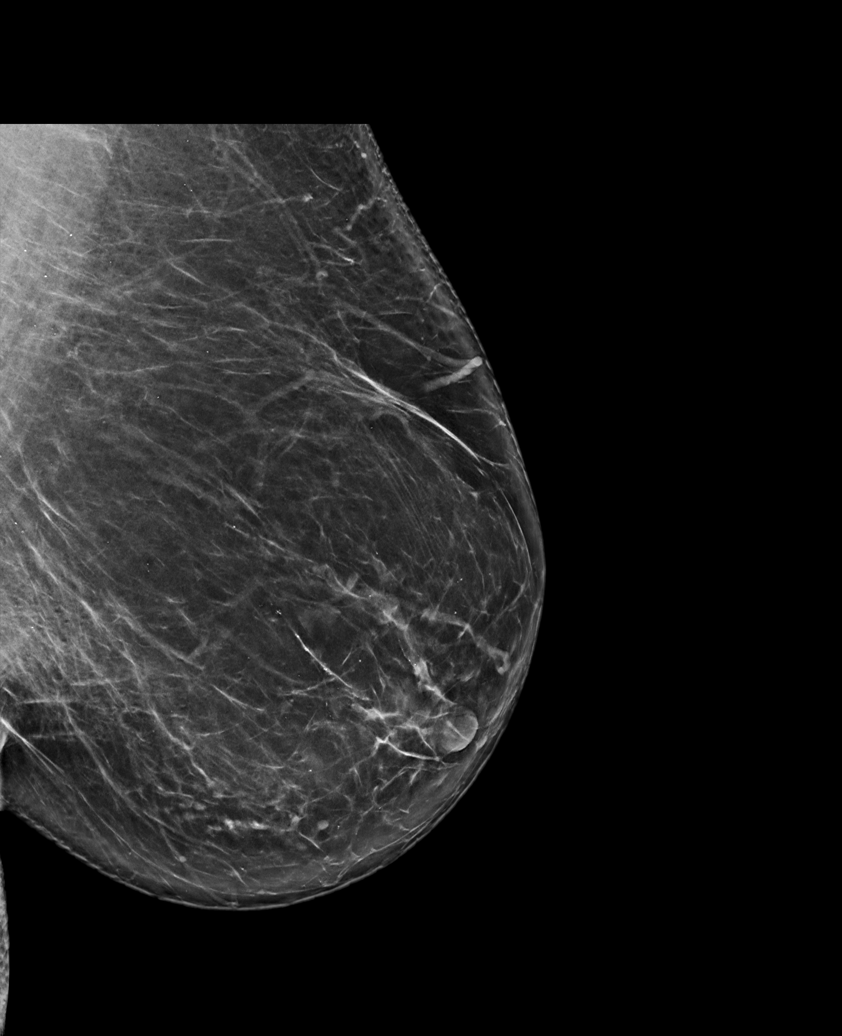

[6 of 26 positions shown; findings below may reference images not displayed]

ACR Breast Density Category b: There are scattered areas of
fibroglandular density.
FINDINGS: Magnification views of the left breast were obtained. There are
linearly oriented coarse heterogeneous calcifications within the
outer left breast demonstrated best on the Mag CC view. No
additional concerning findings within either breast.
IMPRESSION: Indeterminate linearly oriented calcifications within the outer left
breast demonstrated best on the magnification cc view.

RECOMMENDATION:
Stereotactic guided core needle biopsy of the posterior and anterior
aspect of the calcifications within the outer left breast.

I have discussed the findings and recommendations with the patient.
If applicable, a reminder letter will be sent to the patient
regarding the next appointment.

BI-RADS CATEGORY  4: Suspicious.

## 2022-12-16 DIAGNOSIS — I2510 Atherosclerotic heart disease of native coronary artery without angina pectoris: Secondary | ICD-10-CM | POA: Diagnosis not present

## 2022-12-16 DIAGNOSIS — F331 Major depressive disorder, recurrent, moderate: Secondary | ICD-10-CM | POA: Diagnosis not present

## 2022-12-16 DIAGNOSIS — I872 Venous insufficiency (chronic) (peripheral): Secondary | ICD-10-CM | POA: Diagnosis not present

## 2022-12-16 DIAGNOSIS — E785 Hyperlipidemia, unspecified: Secondary | ICD-10-CM | POA: Diagnosis not present

## 2022-12-16 DIAGNOSIS — F17200 Nicotine dependence, unspecified, uncomplicated: Secondary | ICD-10-CM | POA: Diagnosis not present

## 2022-12-16 DIAGNOSIS — I10 Essential (primary) hypertension: Secondary | ICD-10-CM | POA: Diagnosis not present

## 2023-03-07 DIAGNOSIS — D125 Benign neoplasm of sigmoid colon: Secondary | ICD-10-CM | POA: Diagnosis not present

## 2023-03-07 DIAGNOSIS — D127 Benign neoplasm of rectosigmoid junction: Secondary | ICD-10-CM | POA: Diagnosis not present

## 2023-03-07 DIAGNOSIS — D124 Benign neoplasm of descending colon: Secondary | ICD-10-CM | POA: Diagnosis not present

## 2023-03-07 DIAGNOSIS — D120 Benign neoplasm of cecum: Secondary | ICD-10-CM | POA: Diagnosis not present

## 2023-03-07 DIAGNOSIS — K635 Polyp of colon: Secondary | ICD-10-CM | POA: Diagnosis not present

## 2023-05-17 DIAGNOSIS — F17210 Nicotine dependence, cigarettes, uncomplicated: Secondary | ICD-10-CM | POA: Diagnosis not present

## 2023-05-17 DIAGNOSIS — J45909 Unspecified asthma, uncomplicated: Secondary | ICD-10-CM | POA: Diagnosis not present

## 2023-05-17 DIAGNOSIS — M1990 Unspecified osteoarthritis, unspecified site: Secondary | ICD-10-CM | POA: Diagnosis not present

## 2023-05-17 DIAGNOSIS — I10 Essential (primary) hypertension: Secondary | ICD-10-CM | POA: Diagnosis not present

## 2023-05-17 DIAGNOSIS — F330 Major depressive disorder, recurrent, mild: Secondary | ICD-10-CM | POA: Diagnosis not present

## 2023-06-30 DIAGNOSIS — G4719 Other hypersomnia: Secondary | ICD-10-CM | POA: Diagnosis not present

## 2023-06-30 DIAGNOSIS — F439 Reaction to severe stress, unspecified: Secondary | ICD-10-CM | POA: Diagnosis not present

## 2023-06-30 DIAGNOSIS — I2510 Atherosclerotic heart disease of native coronary artery without angina pectoris: Secondary | ICD-10-CM | POA: Diagnosis not present

## 2023-06-30 DIAGNOSIS — F331 Major depressive disorder, recurrent, moderate: Secondary | ICD-10-CM | POA: Diagnosis not present

## 2023-06-30 DIAGNOSIS — Z0000 Encounter for general adult medical examination without abnormal findings: Secondary | ICD-10-CM | POA: Diagnosis not present

## 2023-06-30 DIAGNOSIS — E785 Hyperlipidemia, unspecified: Secondary | ICD-10-CM | POA: Diagnosis not present

## 2023-06-30 DIAGNOSIS — F17210 Nicotine dependence, cigarettes, uncomplicated: Secondary | ICD-10-CM | POA: Diagnosis not present

## 2023-06-30 DIAGNOSIS — N3281 Overactive bladder: Secondary | ICD-10-CM | POA: Diagnosis not present

## 2023-06-30 DIAGNOSIS — I10 Essential (primary) hypertension: Secondary | ICD-10-CM | POA: Diagnosis not present

## 2023-06-30 DIAGNOSIS — E559 Vitamin D deficiency, unspecified: Secondary | ICD-10-CM | POA: Diagnosis not present

## 2023-06-30 DIAGNOSIS — M8588 Other specified disorders of bone density and structure, other site: Secondary | ICD-10-CM | POA: Diagnosis not present

## 2023-07-07 DIAGNOSIS — Z8262 Family history of osteoporosis: Secondary | ICD-10-CM | POA: Diagnosis not present

## 2023-07-07 DIAGNOSIS — N958 Other specified menopausal and perimenopausal disorders: Secondary | ICD-10-CM | POA: Diagnosis not present

## 2023-07-07 DIAGNOSIS — E2839 Other primary ovarian failure: Secondary | ICD-10-CM | POA: Diagnosis not present

## 2023-07-07 DIAGNOSIS — M8588 Other specified disorders of bone density and structure, other site: Secondary | ICD-10-CM | POA: Diagnosis not present

## 2023-07-13 DIAGNOSIS — G4763 Sleep related bruxism: Secondary | ICD-10-CM | POA: Diagnosis not present

## 2023-07-13 DIAGNOSIS — Z636 Dependent relative needing care at home: Secondary | ICD-10-CM | POA: Diagnosis not present

## 2023-07-13 DIAGNOSIS — I2510 Atherosclerotic heart disease of native coronary artery without angina pectoris: Secondary | ICD-10-CM | POA: Diagnosis not present

## 2023-07-13 DIAGNOSIS — I10 Essential (primary) hypertension: Secondary | ICD-10-CM | POA: Diagnosis not present

## 2023-07-13 DIAGNOSIS — G478 Other sleep disorders: Secondary | ICD-10-CM | POA: Diagnosis not present

## 2023-07-13 DIAGNOSIS — R0683 Snoring: Secondary | ICD-10-CM | POA: Diagnosis not present

## 2023-07-13 DIAGNOSIS — F17210 Nicotine dependence, cigarettes, uncomplicated: Secondary | ICD-10-CM | POA: Diagnosis not present

## 2023-07-15 DIAGNOSIS — E785 Hyperlipidemia, unspecified: Secondary | ICD-10-CM | POA: Diagnosis not present

## 2023-07-15 DIAGNOSIS — I10 Essential (primary) hypertension: Secondary | ICD-10-CM | POA: Diagnosis not present

## 2023-07-15 DIAGNOSIS — I2510 Atherosclerotic heart disease of native coronary artery without angina pectoris: Secondary | ICD-10-CM | POA: Diagnosis not present

## 2023-08-17 DIAGNOSIS — G4733 Obstructive sleep apnea (adult) (pediatric): Secondary | ICD-10-CM | POA: Diagnosis not present

## 2023-08-22 DIAGNOSIS — I10 Essential (primary) hypertension: Secondary | ICD-10-CM | POA: Diagnosis not present

## 2023-08-22 DIAGNOSIS — G4733 Obstructive sleep apnea (adult) (pediatric): Secondary | ICD-10-CM | POA: Diagnosis not present

## 2023-09-08 DIAGNOSIS — G4733 Obstructive sleep apnea (adult) (pediatric): Secondary | ICD-10-CM | POA: Diagnosis not present

## 2023-09-14 DIAGNOSIS — I10 Essential (primary) hypertension: Secondary | ICD-10-CM | POA: Diagnosis not present

## 2023-09-14 DIAGNOSIS — I2510 Atherosclerotic heart disease of native coronary artery without angina pectoris: Secondary | ICD-10-CM | POA: Diagnosis not present

## 2023-09-14 DIAGNOSIS — E785 Hyperlipidemia, unspecified: Secondary | ICD-10-CM | POA: Diagnosis not present

## 2023-10-09 DIAGNOSIS — G4733 Obstructive sleep apnea (adult) (pediatric): Secondary | ICD-10-CM | POA: Diagnosis not present

## 2023-10-15 DIAGNOSIS — I2510 Atherosclerotic heart disease of native coronary artery without angina pectoris: Secondary | ICD-10-CM | POA: Diagnosis not present

## 2023-10-15 DIAGNOSIS — E785 Hyperlipidemia, unspecified: Secondary | ICD-10-CM | POA: Diagnosis not present

## 2023-10-15 DIAGNOSIS — I10 Essential (primary) hypertension: Secondary | ICD-10-CM | POA: Diagnosis not present

## 2023-11-14 DIAGNOSIS — E785 Hyperlipidemia, unspecified: Secondary | ICD-10-CM | POA: Diagnosis not present

## 2023-11-14 DIAGNOSIS — I2510 Atherosclerotic heart disease of native coronary artery without angina pectoris: Secondary | ICD-10-CM | POA: Diagnosis not present

## 2023-11-14 DIAGNOSIS — I10 Essential (primary) hypertension: Secondary | ICD-10-CM | POA: Diagnosis not present

## 2023-12-15 DIAGNOSIS — I2510 Atherosclerotic heart disease of native coronary artery without angina pectoris: Secondary | ICD-10-CM | POA: Diagnosis not present

## 2023-12-15 DIAGNOSIS — E785 Hyperlipidemia, unspecified: Secondary | ICD-10-CM | POA: Diagnosis not present

## 2023-12-15 DIAGNOSIS — I10 Essential (primary) hypertension: Secondary | ICD-10-CM | POA: Diagnosis not present

## 2024-01-03 DIAGNOSIS — Z636 Dependent relative needing care at home: Secondary | ICD-10-CM | POA: Diagnosis not present

## 2024-01-03 DIAGNOSIS — I10 Essential (primary) hypertension: Secondary | ICD-10-CM | POA: Diagnosis not present

## 2024-01-03 DIAGNOSIS — E785 Hyperlipidemia, unspecified: Secondary | ICD-10-CM | POA: Diagnosis not present

## 2024-01-03 DIAGNOSIS — I2510 Atherosclerotic heart disease of native coronary artery without angina pectoris: Secondary | ICD-10-CM | POA: Diagnosis not present

## 2024-01-03 DIAGNOSIS — F331 Major depressive disorder, recurrent, moderate: Secondary | ICD-10-CM | POA: Diagnosis not present

## 2024-01-03 DIAGNOSIS — F17210 Nicotine dependence, cigarettes, uncomplicated: Secondary | ICD-10-CM | POA: Diagnosis not present

## 2024-01-08 DIAGNOSIS — H52203 Unspecified astigmatism, bilateral: Secondary | ICD-10-CM | POA: Diagnosis not present

## 2024-01-08 DIAGNOSIS — H25813 Combined forms of age-related cataract, bilateral: Secondary | ICD-10-CM | POA: Diagnosis not present

## 2024-01-08 DIAGNOSIS — H5203 Hypermetropia, bilateral: Secondary | ICD-10-CM | POA: Diagnosis not present
